# Patient Record
Sex: Male | Born: 1965 | Race: White | Hispanic: No | Marital: Married | Smoking: Former smoker
Health system: Southern US, Community
[De-identification: ages and names within clinical notes are randomized; demographics above are authoritative.]

## PROBLEM LIST (undated history)

## (undated) DIAGNOSIS — N529 Male erectile dysfunction, unspecified: Secondary | ICD-10-CM

## (undated) DIAGNOSIS — E785 Hyperlipidemia, unspecified: Secondary | ICD-10-CM

## (undated) DIAGNOSIS — K219 Gastro-esophageal reflux disease without esophagitis: Secondary | ICD-10-CM

## (undated) HISTORY — PX: TONSILLECTOMY: SUR1361

## (undated) HISTORY — DX: Hyperlipidemia, unspecified: E78.5

## (undated) HISTORY — PX: ANKLE SURGERY: SHX546

## (undated) HISTORY — PX: VASECTOMY: SHX75

## (undated) HISTORY — PX: ABSCESS DRAINAGE: SHX1119

## (undated) HISTORY — DX: Gastro-esophageal reflux disease without esophagitis: K21.9

## (undated) HISTORY — DX: Male erectile dysfunction, unspecified: N52.9

---

## 2001-02-08 ENCOUNTER — Emergency Department (HOSPITAL_COMMUNITY): Admission: EM | Admit: 2001-02-08 | Discharge: 2001-02-08 | Payer: Self-pay | Admitting: Emergency Medicine

## 2001-02-08 ENCOUNTER — Encounter: Payer: Self-pay | Admitting: Emergency Medicine

## 2005-10-15 ENCOUNTER — Ambulatory Visit: Payer: Self-pay | Admitting: Family Medicine

## 2006-02-04 ENCOUNTER — Ambulatory Visit: Payer: Self-pay | Admitting: Family Medicine

## 2006-09-15 ENCOUNTER — Ambulatory Visit: Payer: Self-pay | Admitting: Family Medicine

## 2006-09-18 ENCOUNTER — Ambulatory Visit: Payer: Self-pay | Admitting: Family Medicine

## 2006-12-29 ENCOUNTER — Ambulatory Visit: Payer: Self-pay | Admitting: Family Medicine

## 2006-12-30 ENCOUNTER — Encounter: Payer: Self-pay | Admitting: Endocrinology

## 2007-06-04 ENCOUNTER — Ambulatory Visit: Payer: Self-pay | Admitting: Family Medicine

## 2007-08-04 DIAGNOSIS — F528 Other sexual dysfunction not due to a substance or known physiological condition: Secondary | ICD-10-CM

## 2007-08-05 ENCOUNTER — Ambulatory Visit: Payer: Self-pay | Admitting: Endocrinology

## 2007-08-05 DIAGNOSIS — R05 Cough: Secondary | ICD-10-CM

## 2007-08-05 DIAGNOSIS — E785 Hyperlipidemia, unspecified: Secondary | ICD-10-CM

## 2007-08-05 DIAGNOSIS — E109 Type 1 diabetes mellitus without complications: Secondary | ICD-10-CM

## 2008-04-27 ENCOUNTER — Ambulatory Visit: Payer: Self-pay | Admitting: Family Medicine

## 2009-03-10 ENCOUNTER — Ambulatory Visit: Payer: Self-pay | Admitting: Family Medicine

## 2010-02-08 ENCOUNTER — Ambulatory Visit
Admission: RE | Admit: 2010-02-08 | Discharge: 2010-02-08 | Payer: Self-pay | Source: Home / Self Care | Attending: Family Medicine | Admitting: Family Medicine

## 2010-09-26 ENCOUNTER — Ambulatory Visit (INDEPENDENT_AMBULATORY_CARE_PROVIDER_SITE_OTHER): Payer: BC Managed Care – PPO | Admitting: Medical

## 2010-09-26 ENCOUNTER — Encounter: Payer: Self-pay | Admitting: Medical

## 2010-09-26 VITALS — BP 110/78 | HR 72 | Temp 98.0°F | Resp 20 | Ht 73.0 in | Wt 234.0 lb

## 2010-09-26 DIAGNOSIS — R05 Cough: Secondary | ICD-10-CM

## 2010-09-26 DIAGNOSIS — J029 Acute pharyngitis, unspecified: Secondary | ICD-10-CM

## 2010-09-26 NOTE — Progress Notes (Signed)
Subjective:   HPI Sean Callahan is a 45 y.o. male who presents for one day history of sore throat bad cough that kept him up all night.  He noted an irritation in his throat, coughing every 20-30 minutes all night, like he had something stuck in his throat like a hair or particle.  He also notes some headache this morning and some postnasal drip.  He denies other allergy symptoms. No sick contacts.  Otherwise feels fine.   He does take a medication for reflux but can't recall the name.  No other aggravating or relieving factors.  No other c/o.  The following portions of the patient's history were reviewed and updated as appropriate: allergies, current medications, past family history, past medical history, past social history, past surgical history and problem list.  Past Medical History  Diagnosis Date  . Diabetes mellitus     Review of Systems  general: No fever, chills, sweats Skin: No rash HEENT: No ear pain, sinus pressure, runny nose, sneezing GI: No abdominal pain, nausea, vomiting Lungs: No shortness of breath, wheezing, non-productive sputum      Objective:   Physical Exam  General appearance: alert, no distress, WD/WN, well appearing white male Skin: unremarkable HEENT:   conjunctiva normal, left turbinate swollen and pink, right nare clear, TMs pearly, pharynx with postnasal drip and cobblestoning slight erythema, no exit or foreign body  Neck: Supple, no lymphadenopathy, no mass, no thyromegaly Lungs: CTA bilaterally, no wheezes, rhonchi, Rales     Assessment :    Encounter Diagnoses  Name Primary?  . Pharyngitis Yes  . Cough      Plan:     Cough appears to be from postnasal drip.   Advised salt water gargles, warm fluids, over-the-counter Zyrtec, rest, hydrate well, over-the-counter Delsym.  He declined stronger cough medicine today. If worse or not improving in the next 4-5 days let us know.

## 2010-10-23 ENCOUNTER — Telehealth: Payer: Self-pay | Admitting: *Deleted

## 2010-10-23 NOTE — Telephone Encounter (Signed)
Needs appt with Dr. Susann Givens to discuss endocrinology referral.

## 2010-10-23 NOTE — Telephone Encounter (Signed)
Spoke with pt and pt has an appointment with Dr. Susann Givens on 10-26-10 at 8:30am for endocrinology referral.  CM, LPN

## 2010-10-26 ENCOUNTER — Encounter: Payer: Self-pay | Admitting: Family Medicine

## 2010-10-26 ENCOUNTER — Ambulatory Visit (INDEPENDENT_AMBULATORY_CARE_PROVIDER_SITE_OTHER): Payer: BC Managed Care – PPO | Admitting: Family Medicine

## 2010-10-26 DIAGNOSIS — E119 Type 2 diabetes mellitus without complications: Secondary | ICD-10-CM

## 2010-10-26 NOTE — Progress Notes (Signed)
  Subjective:    Patient ID: Sean Callahan, male    DOB: 02/25/1965, 45 y.o.   MRN: [id number]  HPI He is here to discuss diabetes. Presently he is on 15 units of regular before every meal and uses NPH at night 30 units. He has been on this dose and schedule for at least 2 years. He has noted his hemoglobin A1c going from 6.5 to 9.2. He is also had intermittent ailing sensation in his feet and occasionally in his hands. He is being cared for at the [facility]. He and his wife are concerned about the increasing hemoglobin A1c.   Review of Systems     Objective:   Physical Exam Alert and in no distress. Foot exam shows normal sensation and vibratory. Pulses were diminished. Ankle reflexes gone. Skin appears normal.       Assessment & Plan:  Insulin-dependent diabetes Both he and his wife would like him to see an endocrinologist outside the [facility] system for their input into his care. I also discussed the need for him to monitor his sugars more closely and adjust insulin doses to get the blood sugar in the morning under 120. He is also to try and keep his sugars after meals less than 180

## 2010-10-26 NOTE — Patient Instructions (Signed)
Keep your blood sugars in the morning under 120 and try to keep them under 180 2R is after meals. We will call you with the referral to endocrinology.

## 2010-12-10 ENCOUNTER — Encounter: Payer: Self-pay | Admitting: Family Medicine

## 2010-12-11 ENCOUNTER — Institutional Professional Consult (permissible substitution): Payer: BC Managed Care – PPO | Admitting: Medical

## 2011-02-19 ENCOUNTER — Ambulatory Visit (INDEPENDENT_AMBULATORY_CARE_PROVIDER_SITE_OTHER): Payer: BC Managed Care – PPO | Admitting: Medical

## 2011-02-19 ENCOUNTER — Encounter: Payer: Self-pay | Admitting: Medical

## 2011-02-19 VITALS — BP 124/80 | HR 76 | Ht 74.5 in | Wt 242.0 lb

## 2011-02-19 DIAGNOSIS — R209 Unspecified disturbances of skin sensation: Secondary | ICD-10-CM

## 2011-02-19 DIAGNOSIS — M2550 Pain in unspecified joint: Secondary | ICD-10-CM

## 2011-02-19 DIAGNOSIS — N529 Male erectile dysfunction, unspecified: Secondary | ICD-10-CM

## 2011-02-19 DIAGNOSIS — M79609 Pain in unspecified limb: Secondary | ICD-10-CM

## 2011-02-19 NOTE — Progress Notes (Signed)
  Subjective:   HPI Sean Callahan is a 46 y.o. male who presents for multiple issues.  For at least the last 2-3 months he has had issues with numbness and pain in bilat hands.  He has had numbness in feet during the same time, but not pain like in the hands.  He says he numbness alternates between left and right hands, started out as numbness mostly in 1st and 2nd fingers, but now more 2nd - 4th fingers, worse on the left.  He says he can awake in the morning with numbness despite sleeping on a particular side.  He denies neck pain.  He does get joint pain in the left hand.  Gets stiffness in the same joint.   Denies joint swelling or redness.  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  No prior diagnosis or treatment for this other that suspected neuropathy since he is a diabetic and his HgbA1C has not been controlled.  However, this has recently improved from 9.2% down to 7%. He sees the [facility] for most of his labs and chronic issues.   He notes seeing ortho a while back for adhesive capsulitis, but no self or family hx/o rheumatologic disease except grandmother with arthritis.   He denies burning pain.  No recent trauma or injury in the last several moths.  No other joint or neck pain.  No other aggravating or relieving factors.      He has ED, and does well on Viagra.  He uses a Systems developer.   Wants script refill. No other c/o.  The following portions of the patient's history were reviewed and updated as appropriate: allergies, current medications, past family history, past medical history, past social history, past surgical history and problem list.  Past Medical History  Diagnosis Date  . Diabetes mellitus   . ED (erectile dysfunction)   . Hyperlipidemia   . GERD (gastroesophageal reflux disease)    Review of Systems Gen: no fever, chills, sweats, wt changes Skin: no rash Heart: no CP, palpitations, edema Lungs: no SOB or wheezing GI: negative     Objective:   Physical Exam  General appearance: alert, no distress, WD/WN Skin: no rash Heart: RRR, normal S1, S2 MSK: tender over left MCPs in general, no bony abnormity, but otherwise nontender, no joint swelling, rest of bilat UE including shoulder exam with normal ROM, nontender, negative special tests of shoulders Neuro: bilat hands with normal sensation to light and sharp touch, -Tinels but somewhat positive Phalens, normal strength bilat, otherwise neuro exam of extremities seems WNL Pulses: 2+ bilat UE and LE    Assessment and Plan:    Encounter Diagnoses  Name Primary?  . Paresthesia and pain of extremity Yes  . ED (erectile dysfunction)   . Joint pain    Paresthesias - likely diabetic neuropathy, but possibly carpal and ulnar nerve syndromes as well.  Will refer for EMG/NCS.  I asked him to get me copies of the recent labs including B12 he had at the [facility].  ED - refilled medications  Joint pain - OTC Tylenol advised.  Follow up pending studies.

## 2011-02-20 ENCOUNTER — Telehealth: Payer: Self-pay | Admitting: Family Medicine

## 2011-02-20 ENCOUNTER — Other Ambulatory Visit: Payer: Self-pay | Admitting: Medical

## 2011-02-20 ENCOUNTER — Encounter: Payer: Self-pay | Admitting: Medical

## 2011-02-20 DIAGNOSIS — M2550 Pain in unspecified joint: Secondary | ICD-10-CM | POA: Insufficient documentation

## 2011-02-20 DIAGNOSIS — R202 Paresthesia of skin: Secondary | ICD-10-CM | POA: Insufficient documentation

## 2011-02-20 DIAGNOSIS — N529 Male erectile dysfunction, unspecified: Secondary | ICD-10-CM | POA: Insufficient documentation

## 2011-02-20 MED ORDER — SILDENAFIL CITRATE 100 MG PO TABS: ORAL_TABLET | ORAL | Status: DC

## 2011-02-20 MED ORDER — SILDENAFIL CITRATE 25 MG PO TABS: 100.0000 mg | ORAL_TABLET | Freq: Every day | ORAL | Status: DC | PRN

## 2011-02-20 NOTE — Telephone Encounter (Signed)
Message copied by Janeice Robinson on Wed Feb 20, 2011  2:23 PM ------      Message from: Leola, Keston S      Created: Wed Feb 20, 2011  4:34 AM       Karren Burly - pls set up EMG/NCS (nerve conduction studies) for upper and lower extremities.  [facility] believe [facility] them, but hospital may do them as well. Pls call pt with appt time.            Melissa - not sure if I ordered the test right in the system

## 2011-02-20 NOTE — Telephone Encounter (Signed)
Patients appointment is schedule at [facility] on1/28/13 and lower on 02/28/11. CLS

## 2011-03-18 ENCOUNTER — Telehealth: Payer: Self-pay | Admitting: Family Medicine

## 2011-03-18 ENCOUNTER — Other Ambulatory Visit: Payer: Self-pay | Admitting: Medical

## 2011-03-18 MED ORDER — GABAPENTIN 100 MG PO CAPS: ORAL_CAPSULE | ORAL | Status: DC

## 2011-03-18 NOTE — Telephone Encounter (Signed)
PATIENT STATES THAT HE WILL TRY THE WRIST SPLINTS FIRST TO SEE IF THEY HELP. HE WILL CALL us BACK TO LET us KNOW IN A FEW WEEKS. PATIENT STATES THAT HE WILLING TO TRY THE GABAPENTIN SO IF YOU COULD PLEASE SEND IT OUT TO HIS PHARMACY. CLS    PATIENT WAS NOTIFIED OF HIS NERVE CONDUCTIONS STUDIES. CLS

## 2011-03-18 NOTE — Telephone Encounter (Signed)
Message copied by Janeice Robinson on Mon Mar 18, 2011 11:39 AM ------      Message from: Aleen Campi, Kyston S      Created: Mon Mar 18, 2011  8:09 AM       His nerve conduction studies are back.  Both upper and lower are abnormal.  The results suggest both diabetic neuropathy as well as carpal tunnel syndrome.             Regarding the carpal tunnel (CTS) - see if he is using night time wrist splints.  If he has been doing this already, then next steps would include either injection at the wrist of steroid or potential surgical correction of the CTS.  Dr. Susann Givens may do the injection if he wants to start with this.  Otherwise we can refer for surgical consult.   See what he wants to do (if referral, does he want ortho here local or at the [facility]?).            Regarding lower extremity neuropathy, I can start him on a medication called Gabapentin.  See if agreeable?

## 2011-03-18 NOTE — Progress Notes (Signed)
LMOM FOR THE PATIENT TO CALLBACK ABOUT HIS PHARMACY. CLS

## 2011-03-21 ENCOUNTER — Ambulatory Visit (INDEPENDENT_AMBULATORY_CARE_PROVIDER_SITE_OTHER): Payer: BC Managed Care – PPO | Admitting: Family Medicine

## 2011-03-21 ENCOUNTER — Encounter: Payer: Self-pay | Admitting: Family Medicine

## 2011-03-21 VITALS — BP 120/70 | HR 81 | Wt 239.0 lb

## 2011-03-21 DIAGNOSIS — IMO0002 Reserved for concepts with insufficient information to code with codable children: Secondary | ICD-10-CM

## 2011-03-21 DIAGNOSIS — T07XXXA Unspecified multiple injuries, initial encounter: Secondary | ICD-10-CM

## 2011-03-21 MED ORDER — GABAPENTIN 100 MG PO CAPS: ORAL_CAPSULE | ORAL | Status: DC

## 2011-03-21 NOTE — Patient Instructions (Signed)
Return here if the abrasions do not continue to heal.

## 2011-03-21 NOTE — Progress Notes (Signed)
  Subjective:    Patient ID: Sean Callahan, male    DOB: 1965-01-31, 46 y.o.   MRN: [id number]  HPI Approximately one week ago he injured himself while [place] in the [place]. He did have some abrasions to the anterior aspect of both shins, right greater than left. He is here for evaluation of this. He states that this is healing well but his wife wanted him to come in.   Review of Systems     Objective:   Physical Exam Healing abrasions are noted on both hands. There is slight erythema around the scabs however it is not warm or tender.       Assessment & Plan:  Healing shin abrasions. Continue with conservative care. He will return here if he gets more red hot or tender. Also his Neurontin will be sent for renewal to the [facility]

## 2011-03-22 NOTE — Progress Notes (Signed)
Have you got in touch with him?

## 2011-03-25 NOTE — Progress Notes (Signed)
SHANE I HAVE SPOKE WITH THIS PATIENT ALREADY ABOUT HIS RESULTS AND THE RX AND THE PHARMACY. HE WILL CONTACT us IF THE MEDICATION AND THE WRIST SPLINTS ARE NOT HELPING. CLS

## 2011-04-15 ENCOUNTER — Other Ambulatory Visit: Payer: Self-pay | Admitting: Medical

## 2011-04-15 MED ORDER — GABAPENTIN 100 MG PO CAPS: ORAL_CAPSULE | ORAL | Status: DC

## 2011-05-23 ENCOUNTER — Encounter: Payer: Self-pay | Admitting: Specialist

## 2011-06-05 ENCOUNTER — Ambulatory Visit (INDEPENDENT_AMBULATORY_CARE_PROVIDER_SITE_OTHER): Payer: BC Managed Care – PPO | Admitting: Medical

## 2011-06-05 ENCOUNTER — Encounter: Payer: Self-pay | Admitting: Medical

## 2011-06-05 VITALS — BP 112/78 | HR 100 | Temp 99.5°F | Resp 16 | Wt 237.0 lb

## 2011-06-05 DIAGNOSIS — J329 Chronic sinusitis, unspecified: Secondary | ICD-10-CM

## 2011-06-05 DIAGNOSIS — R05 Cough: Secondary | ICD-10-CM

## 2011-06-05 DIAGNOSIS — J40 Bronchitis, not specified as acute or chronic: Secondary | ICD-10-CM

## 2011-06-05 MED ORDER — HYDROCODONE-HOMATROPINE 5-1.5 MG/5ML PO SYRP: 5.0000 mL | ORAL_SOLUTION | Freq: Three times a day (TID) | ORAL | Status: AC | PRN

## 2011-06-05 MED ORDER — AMOXICILLIN-POT CLAVULANATE 875-125 MG PO TABS: 1.0000 | ORAL_TABLET | Freq: Two times a day (BID) | ORAL | Status: AC

## 2011-06-05 NOTE — Patient Instructions (Signed)
Begin Augmentin antibiotic twice daily for 10 days for sinobronchitis.  Rest, drink plenty of water.   Consider using OTC Zyrtec or Benadryl or Coricidin HBP for congestion and allergy symptom.  Use the Hycodan syrup for worse cough either at bedtime or every 4-6 hours as needed; can cause drowsiness.    If worse or not improving, call or return.

## 2011-06-05 NOTE — Progress Notes (Signed)
Subjective: Here for c/o cough and illness.  He has been working outdoors several days, thought he was getting some allergy problems.  But last 3 days with cough, low grade fever, constant cough lying flat, cough every 15-20 minutes standing, some chest tightness, sinus pressure, sore throat, post nasal drainage.  He currently feels like he did 10 years ago when he came down with pneumonia.   Using Tylenol for symptoms.  Son has been sick with sinusitis 2 wk ago.  No other aggravating or relieving factors.  No other c/o.  Past Medical History  Diagnosis Date  . Diabetes mellitus   . ED (erectile dysfunction)   . Hyperlipidemia   . GERD (gastroesophageal reflux disease)    ROS as noted in HPI  Objective:   Filed Vitals:   06/05/11 0916  BP: 112/78  Pulse: 100  Temp: 99.5 F (37.5 C)  Resp: 16    General appearance: Alert, WD/WN, no distress                             Skin: warm/hot, moist                           Head: + mild maxillary sinus tenderness,                            Eyes: conjunctiva normal, corneas clear, PERRLA                            Ears: flatTMs, external ear canals normal                          Nose: septum midline, turbinates swollen, with erythema and clear discharge             Mouth/throat: MMM, tongue normal, mild pharyngeal erythema                           Neck: supple, no adenopathy, no thyromegaly, nontender                          Heart: RRR, normal S1, S2, no murmurs                         Lungs: CTA bilaterally, no wheezes, rales, or rhonchi      Assessment and Plan:   Encounter Diagnoses  Name Primary?  . Sinobronchitis Yes  . Cough    Prescription given for Augmentin and Hycodan syrup.  Can use OTC Coricidin HBP and/or Zyrtec for congestion.  Tylenol or Ibuprofen OTC for fever and malaise.  Discussed symptomatic relief, nasal saline, and call or return if worse or not improving in 2-3 days.

## 2011-06-12 ENCOUNTER — Telehealth: Payer: Self-pay | Admitting: Medical

## 2011-06-12 NOTE — Telephone Encounter (Signed)
Lets finish antibiotic, rest, hydrate well.  Hopefully by the time he finishes the antibiotic will feel resolved.  If not let me know

## 2011-06-12 NOTE — Telephone Encounter (Signed)
PATIENT WAS NOTIFIED OF Kristian Covey PA-C RECOMMENDATIONS FOR HIS CARE. CLS

## 2012-04-21 ENCOUNTER — Emergency Department: Payer: Self-pay | Admitting: Emergency Medicine

## 2012-04-22 ENCOUNTER — Ambulatory Visit (INDEPENDENT_AMBULATORY_CARE_PROVIDER_SITE_OTHER): Payer: BC Managed Care – PPO | Admitting: Medical

## 2012-04-22 ENCOUNTER — Encounter: Payer: Self-pay | Admitting: Medical

## 2012-04-22 VITALS — BP 122/80 | HR 94 | Temp 97.9°F | Resp 16 | Wt 233.0 lb

## 2012-04-22 DIAGNOSIS — N529 Male erectile dysfunction, unspecified: Secondary | ICD-10-CM

## 2012-04-22 DIAGNOSIS — T148 Other injury of unspecified body region: Secondary | ICD-10-CM

## 2012-04-22 DIAGNOSIS — W57XXXA Bitten or stung by nonvenomous insect and other nonvenomous arthropods, initial encounter: Secondary | ICD-10-CM

## 2012-04-22 MED ORDER — DOXYCYCLINE HYCLATE 100 MG PO TABS: 100.0000 mg | ORAL_TABLET | Freq: Two times a day (BID) | ORAL | Status: DC

## 2012-04-22 MED ORDER — SILDENAFIL CITRATE 100 MG PO TABS: ORAL_TABLET | ORAL | Status: DC

## 2012-04-22 NOTE — Progress Notes (Signed)
Subjective: Here for tick bite.   Was clearing out brush in yard, wearing boots, jeans.  Checked body afterwards, didn't see ticks, but next day found tick on bottom of penis at foreskin.   Removed the tick carefully.  Hasn't seen other ticks on body.  Denies rash, but he has had some headaches and has some swelling at the penis.   Went to the ED last night for the same.  Waited 7 hours in the ED without being seen and decided to f/u here.  Denies fever, but has felt kind of hot on and off.  Denies body aches, rash, nausea, vomiting, diarrhea, overall feels fine except being tired.  Just got into town and got off airplane yesterday, been up 48 hours, so he is tired and not sure how much of his symptoms are related to lack of sleep and needing to rest.    Also needs Viagra refilled.  Does well on this, usually sends script for cheaper medication in [place].  Past Medical History  Diagnosis Date  . Diabetes mellitus   . ED (erectile dysfunction)   . Hyperlipidemia   . GERD (gastroesophageal reflux disease)    ROS as in subjective   Objective: Gen: wd, wn, nad Skin: volar side of distal penis just proximal to glans with small 2mm crusted area from prior bite, mild generalized swelling, no erythema, no warmth, no pus, small 2mm area of round flat erythema of left dorsal glans of penis ridge   Assessment: Encounter Diagnoses  Name Primary?  . Tick bite Yes  . Erectile dysfunction    Plan: Tick bite - no current worrisome signs for RMSF or lyme.   Advised soap and water, local skin care for the bite area of the penis, cool pack to penis for swelling, benadryl OTC and Aleve OTC the next few days.  If any signs of tick borne illness as discussed - fever, joint aches, body aches, rash, continuing headache, nausea, vomiting, etc, then begin Doxycycline and call/recheck.    ED - refilled Viagra, works well for him.   No adverse effects, no problems with the medication.

## 2012-04-22 NOTE — Patient Instructions (Signed)
[place] Spotted Fever [place] Spotted Fever (RMSF) is the oldest known tick-borne disease of people in the [place]. This disease was named because it was first described among people in the [place] area who had an illness characterized by a rash with red-purple-black spots. This disease is caused by a rickettsia (Rickettsia rickettsii), a bacteria carried by the tick. The [place] wood tick and the American dog tick, acquire and transmit the RMSF bacteria (pictures NOT actual size). When a larval, nymphal or adult tick feeds on an infected rodent or larger animal, the tick can become infected. Infected adult ticks then feed on people who may then get RMSF. The tick transmits the disease to humans during a prolonged period of feeding that lasts many hours, days or even a couple weeks. The bite is painless and frequently goes unnoticed. An infected male tick may also pass the rickettsial bacteria to her eggs that then may mature to be infected adult ticks. The rickettsia that causes RMSF can also get into a person's body through damaged skin. A tick bite is not necessary. People can get RMSF if they crush a tick and get it's blood or body fluids on their skin through a small cut or sore.  DIAGNOSIS Diagnosis is made by laboratory tests.  TREATMENT Treatment is with antibiotics (medications that kill rickettsia and other bacteria). Immediate treatment usually prevents death. GEOGRAPHIC RANGE This disease was reported only in the [place] until 1931. RMSF has more recently been described among individuals in all states except [place], [place] and [place]. The highest reported incidences of RMSF now occur among residents of [place], [place], [place] and the [place]. TIME OF YEAR  Most cases are diagnosed during late spring and summer when ticks are most active. However, especially in the warmer southern states, a few cases occur during the winter. SYMPTOMS    Symptoms of RMSF begin from 2 to 14 days after a tick bite. The most common early symptoms are fever, muscle aches and headache followed by nausea (feeling sick to your stomach) or vomiting.  The RMSF rash is typically delayed until 3 or more days after symptom onset, and eventually develops in 9 of 10 infected patients by the 5th day of illness. If the disease is not treated it can cause death. If you get a fever, headache, muscle aches, rash, nausea or vomiting within 2 weeks of a possible tick bite or exposure you should see your caregiver immediately. PREVENTION Ticks prefer to hide in shady, moist ground litter. They can often be found above the ground clinging to tall grass, brush, shrubs and low tree branches. They also inhabit lawns and gardens, especially at the edges of woodlands and around old stone walls. Within the areas where ticks generally live, no naturally vegetated area can be considered completely free of infected ticks. The best precaution against RMSF is to avoid contact with soil, leaf litter and vegetation as much as possible in tick infested areas. For those who enjoy gardening or walking in their yards, clear brush and mow tall grass around houses and at the edges of gardens. This may help reduce the tick population in the immediate area. Applications of chemical insecticides by a licensed professional in the spring (late May) and Fall (September) will also control ticks, especially in heavily infested areas. Treatment will never get rid of all the ticks. Getting rid of small animal populations that host ticks will also decrease the tick population. When working in the garden,   pruning shrubs, or handling soil and vegetation, wear light-colored protective clothing and gloves. Spot-check often to prevent ticks from reaching the skin. Ticks cannot jump or fly. They will not drop from an above-ground perch onto a passing animal. Once a tick gains access to human skin it climbs upward  until it reaches a more protected area. For example, the back of the knee, groin, navel, armpit, ears or nape of the neck. It then begins the slow process of embedding itself in the skin. Campers, hikers, field workers, and others who spend time in wooded, brushy or tall grassy areas can avoid exposure to ticks by using the following precautions:  Wear light-colored clothing with a tight weave to spot ticks more easily and prevent contact with the skin.  Wear long pants tucked into socks, long-sleeved shirts tucked into pants and enclosed shoes or boots along with insect repellent.  Spray clothes with insect repellent containing either DEET or Permethrin. Only DEET can be used on exposed skin. Follow the manufacturer's directions carefully.  Wear a hat and keep long hair pulled back.  Stay on cleared, well-worn trails whenever possible.  Spot-check yourself and others often for the presence of ticks on clothes. If you find one, there are likely to be others. Check thoroughly.  Remove clothes after leaving tick-infested areas. If possible, wash them to eliminate any unseen ticks. Check yourself, your children and any pets from head to toe for the presence of ticks.  Shower and shampoo. You can greatly reduce your chances of contracting RMSF if you remove attached ticks as soon as possible. Regular checks of the body, including all body sites covered by hair (head, armpits, genitals), allow removal of the tick before rickettsial transmission. To remove an attached tick, use a forceps or tweezers to detach the intact tick without leaving mouth parts in the skin. The tick bite wound should be cleansed after tick removal. Remember the most common symptoms of RMSF are fever, muscle aches, headache and nausea or vomiting with a later onset of rash. If you get these symptoms after a tick bite and while living in an area where RMSF is found, RMSF should be suspected. If the disease is not treated, it can  cause death. See your caregiver immediately if you get these symptoms. Do this even if not aware of a tick bite. Document Released: 04/28/2000 Document Revised: 04/08/2011 Document Reviewed: 12/19/2008 Exit[employer] Patient Information 2013 [employer]. Lyme Disease You may have been bitten by a tick and are to watch for the development of Lyme Disease. Lyme Disease is an infection that is caused by a bacteria The bacteria causing this disease is named Borreilia burgdorferi. If a tick is infected with this bacteria and then bites you, then Lyme Disease may occur. These ticks are carried by deer and rodents such as rabbits and mice and infest grassy as well as forested areas. Fortunately most tick bites do not cause Lyme Disease.  Lyme Disease is easier to prevent than to treat. First, covering your legs with clothing when walking in areas where ticks are possibly abundant will prevent their attachment because ticks tend to stay within inches of the ground. Second, using insecticides containing DEET can be applied on skin or clothing. Last, because it takes about 12 to 24 hours for the tick to transmit the disease after attachment to the human host, you should inspect your body for ticks twice a day when you are in areas where Lyme Disease is common. You must   look thoroughly when searching for ticks. The Ixodes tick that carries Lyme Disease is very small. It is around the size of a sesame seed (picture of tick is not actual size). Removal is best done by grasping the tick by the head and pulling it out. Do not to squeeze the body of the tick. This could inject the infecting bacteria into the bite site. Wash the area of the bite with an antiseptic solution after removal.  Lyme Disease is a disease that may affect many body systems. Because of the small size of the biting tick, most people do not notice being bitten. The first sign of an infection is usually a round red rash that extends out from the center of  the tick bite. The center of the lesion may be blood colored (hemorrhagic) or have tiny blisters (vesicular). Most lesions have bright red outer borders and partial central clearing. This rash may extend out many inches in diameter, and multiple lesions may be present. Other symptoms such as fatigue, headaches, chills and fever, general achiness and swelling of lymph glands may also occur. If this first stage of the disease is left untreated, these symptoms may gradually resolve by themselves, or progressive symptoms may occur because of spread of infection to other areas of the body.  Follow up with your caregiver to have testing and treatment if you have a tick bite and you develop any of the above complaints. Your caregiver may recommend preventative (prophylactic) medications which kill bacteria (antibiotics). Once a diagnosis of Lyme Disease is made, antibiotic treatment is highly likely to cure the disease. Effective treatment of late stage Lyme Disease may require longer courses of antibiotic therapy.  MAKE SURE YOU:   Understand these instructions.  Will watch your condition.  Will get help right away if you are not doing well or get worse. Document Released: 04/22/2000 Document Revised: 04/08/2011 Document Reviewed: 06/24/2008 Exit[employer] Patient Information 2013 [employer].  

## 2012-05-19 ENCOUNTER — Encounter: Payer: Self-pay | Admitting: Medical

## 2012-05-19 ENCOUNTER — Ambulatory Visit (INDEPENDENT_AMBULATORY_CARE_PROVIDER_SITE_OTHER): Payer: BC Managed Care – PPO | Admitting: Medical

## 2012-05-19 VITALS — BP 100/70 | HR 68 | Temp 97.8°F | Resp 16 | Wt 234.0 lb

## 2012-05-19 DIAGNOSIS — H9313 Tinnitus, bilateral: Secondary | ICD-10-CM

## 2012-05-19 DIAGNOSIS — H9202 Otalgia, left ear: Secondary | ICD-10-CM

## 2012-05-19 DIAGNOSIS — H9209 Otalgia, unspecified ear: Secondary | ICD-10-CM

## 2012-05-19 DIAGNOSIS — H9319 Tinnitus, unspecified ear: Secondary | ICD-10-CM

## 2012-05-19 DIAGNOSIS — R51 Headache: Secondary | ICD-10-CM

## 2012-05-19 NOTE — Progress Notes (Signed)
Subjective: Here for recheck.  Saw me recently for tick bite and other symptoms.  Finished antibiotic (doxycycline) this last visit.   Insect bites are healing up just fine.  Was feeling fine until starting to have symptoms 1.5 wk ago.  He notes 1.5 wk hx/o joints pains in shoulders and neck, ringing in the ears.  4-5 days ago started getting intense pain around left side of his head.     He has had ringing in the ears 6-7 years ago, lasted for several months.  Ended up resolving on its on.  This time the ringing in ears seemed to start suddenly.  Wonders about his left ear. Has pain in left side of face.  Pain is ear ache like.   Pain is at times dull, at times nagging, at times sharp.  Has to have background noise on the help.  Pain is mostly left postauricular region.  No facial or frontal pain.  No fever.  Occasionally dizziness.   Seems like hearing is difficult on the left.   Having some nasal congestion in the evenings, but no during the day.   No sinus pressure, but some headache frontal/generalized.   No popping in ears.  Using nothing for symptoms other than Tylenol.    Having some mild joint pains in shoulders and neck.     Past Medical History  Diagnosis Date  . Diabetes mellitus   . ED (erectile dysfunction)   . Hyperlipidemia   . GERD (gastroesophageal reflux disease)     ROS as in subjective  Objective: Filed Vitals:   05/19/12 1410  BP: 100/70  Pulse: 68  Temp: 97.8 F (36.6 C)  Resp: 16    General appearance: alert, no distress, WD/WN Head nontender, no TMJ tenderness  HEENT: normocephalic, sclerae anicteric, TMs pearly, nares patent, no discharge or erythema, pharynx normal Oral cavity: MMM, no lesions Neck: supple, no lymphadenopathy, no thyromegaly, no masses Heart: RRR, normal S1, S2, no murmurs Lungs: CTA bilaterally, no wheezes, rhonchi, or rales Pulses: 2+ symmetric, upper and lower extremities, normal cap refill Neuro: Cn2-12 intact, nonfocal exam Weber and  rinne normal  Assessment: Encounter Diagnoses  Name Primary?  . Tinnitus of both ears Yes  . Headache   . Otalgia of left ear     Plan: reviewed hearing screen today.   Etiology not clear.   No clear meniere's or other, no obvious TMJ, trigeminal neuralgia or other. No exam signs of TM effusion.  Advised short term 1-2 wk trial of zyrtec or benadryl, stop aspirin temporarily, back off alcohol and caffeine use compared to normal, hydrate well, and if no improvement in 1-2 wk, referral to ENT.

## 2012-05-19 NOTE — Patient Instructions (Signed)
For the next 2 weeks Back off alcohol, caffeine and aspirin daily  Begin Zyrtec or Benadryl at night time, increase water intake  If no improvement in 1-2 weeks call back.  Tinnitus Sounds you hear in your ears and coming from within the ear is called tinnitus. This can be a symptom of many ear disorders. It is often associated with hearing loss.  Tinnitus can be seen with:  Infections.  Ear blockages such as wax buildup.  Meniere's disease.  Ear damage.  Inherited.  Occupational causes. While irritating, it is not usually a threat to health. When the cause of the tinnitus is wax, infection in the middle ear, or foreign body it is easily treated. Hearing loss will usually be reversible.  TREATMENT  When treating the underlying cause does not get rid of tinnitus, it may be necessary to get rid of the unwanted sound by covering it up with more pleasant background noises. This may include music, the radio etc. There are tinnitus maskers which can be worn which produce background noise to cover up the tinnitus. Avoid all medications which tend to make tinnitus worse such as alcohol, caffeine, aspirin, and nicotine. There are many soothing background tapes such as rain, ocean, thunderstorms, etc. These soothing sounds help with sleeping or resting. Keep all follow-up appointments and referrals. This is important to identify the cause of the problem. It also helps avoid complications, impaired hearing, disability, or chronic pain. Document Released: 01/14/2005 Document Revised: 04/08/2011 Document Reviewed: 09/02/2007 [facility] Patient Information 2013 [place], [place].

## 2012-05-25 ENCOUNTER — Telehealth: Payer: Self-pay | Admitting: Family Medicine

## 2012-05-25 NOTE — Telephone Encounter (Signed)
Go ahead and refer for tinnitus - ENT, Dr. Ezzard Standing

## 2012-05-25 NOTE — Telephone Encounter (Signed)
Pt called and states his situation has not changed and he wants to go ahead with the ENT Referral.  Please call him with appt [phone]

## 2012-05-25 NOTE — Telephone Encounter (Signed)
Patient states that he is not any better and wants a referral to ENT. What is he being seen for? CLS

## 2012-05-25 NOTE — Telephone Encounter (Signed)
Working on referral. CLS 

## 2012-05-26 NOTE — Telephone Encounter (Signed)
Patient is aware of his appointment to see Dr. Ezzard Standing on Jun 02, 2012 @ 100 pm. CLS [phone]

## 2012-08-03 ENCOUNTER — Encounter: Payer: Self-pay | Admitting: Medical

## 2012-08-03 ENCOUNTER — Ambulatory Visit (INDEPENDENT_AMBULATORY_CARE_PROVIDER_SITE_OTHER): Payer: BC Managed Care – PPO | Admitting: Medical

## 2012-08-03 VITALS — BP 120/80 | HR 68 | Temp 97.7°F | Resp 16 | Wt 232.0 lb

## 2012-08-03 DIAGNOSIS — R0789 Other chest pain: Secondary | ICD-10-CM

## 2012-08-03 DIAGNOSIS — R0600 Dyspnea, unspecified: Secondary | ICD-10-CM

## 2012-08-03 DIAGNOSIS — R0609 Other forms of dyspnea: Secondary | ICD-10-CM

## 2012-08-03 LAB — CBC WITH DIFFERENTIAL/PLATELET
Eosinophils Absolute: 0 10*3/uL (ref 0.0–0.7)
HCT: 46.7 % (ref 39.0–52.0)
Hemoglobin: 16.5 g/dL (ref 13.0–17.0)
Lymphs Abs: 1.4 10*3/uL (ref 0.7–4.0)
MCH: 32.2 pg (ref 26.0–34.0)
Monocytes Relative: 7 % (ref 3–12)
Neutrophils Relative %: 61 % (ref 43–77)
RBC: 5.12 MIL/uL (ref 4.22–5.81)

## 2012-08-03 LAB — BASIC METABOLIC PANEL
Chloride: 102 mEq/L (ref 96–112)
Creat: 1.05 mg/dL (ref 0.50–1.35)
Potassium: 4.1 mEq/L (ref 3.5–5.3)
Sodium: 137 mEq/L (ref 135–145)

## 2012-08-03 MED ORDER — DEXLANSOPRAZOLE 60 MG PO CPDR: 60.0000 mg | DELAYED_RELEASE_CAPSULE | Freq: Every day | ORAL | Status: DC

## 2012-08-03 NOTE — Progress Notes (Signed)
Subjective:    Sean Callahan is a 47 y.o. male who presents for evaluation of chest discomfort. Onset was 2 weeks ago.  Has been having some intermittent episodes of chest discomfort bilat chest along sternal border, lower.  Been getting an episode every few days, lasts 1 to several hours at a time, variable.  At times it has been difficult to catch his breath, particularly sitting.  When he stands, seems to help.  The past few days has gotten some lightheadedness along with these symptoms.  No syncope.   No prior similar.   When not having the episodes, can go run up a hill or get on the elliptical for normal exercise.  Chest discomfort is a soreness.  Main concern is not able to catch full breath.  Patient's cardiac risk factors are: diabetes mellitus and dyslipidemia. Patient's risk factors for DVT/PE: none. Previous cardiac testing: none.  The following portions of the patient's history were reviewed and updated as appropriate: allergies, current medications, past family history, past medical history, past social history, past surgical history and problem list.  Past Medical History  Diagnosis Date  . Diabetes mellitus   . ED (erectile dysfunction)   . Hyperlipidemia   . GERD (gastroesophageal reflux disease)      Review of Systems Constitutional: denies fever, chills, sweats, unexpected weight change, anorexia, fatigue ENT: no runny nose, ear pain, sore throat, hoarseness, sinus pain, hearing loss, epistaxis Gastroenterology: denies abdominal pain,  vomiting, diarrhea, constipation,  Hematology: denies bleeding or bruising problems Musculoskeletal: denies arthralgias, myalgias, joint swelling, back pain, neck pain, cramping, gait changes Ophthalmology: denies vision changes Urology: denies dysuria, difficulty urinating, hematuria, urinary frequency, urgency, incontinence Neurology: no headache, weakness, tingling, numbness, speech abnormality, memory loss, falls, dizziness     Objective:       Filed Vitals:   08/03/12 0952  BP: 120/80  Pulse: 68  Temp: 97.7 F (36.5 C)  Resp: 16    General appearance: alert, no distress, WD/WN, male  HEENT: normocephalic, conjunctiva/corneas normal, sclerae anicteric, PERRLA, EOMi, nares patent, no discharge or erythema, pharynx normal Neck: supple, no lymphadenopathy, no thyromegaly, no JVD, no masses Chest: non tender, normal shape and expansion Heart: RRR, normal S1, S2, no murmurs Lungs: CTA bilaterally, no wheezes, rhonchi, or rales Abdomen: +bs, soft, non tender, non distended, no masses, no hepatomegaly, no splenomegaly, no bruits Extremities: no edema, no cyanosis, no clubbing Pulses: 2+ symmetric, upper and lower extremities, normal cap refill Neurological: alert, oriented x 3, CN2-12 intact, non focal    Cardiographic  Adult ECG Report  Indication: chest tightness  Rate: 66 bpm  Rhythm: normal sinus rhythm  QRS Axis: 56 degrees  PR Interval:  QRS Duration: 86ms  QTc:  Conduction Disturbances: none  Other Abnormalities: none  Patient's cardiac risk factors are: diabetes mellitus, dyslipidemia and male gender.  EKG comparison: none  Narrative Interpretation: normal EKG   Imaging CXR with no obvious abnormalities, some prominent left hilar lymph nodes.  Normal cardiac silhouette.  Will send for radiology over read.    Assessment:   Encounter Diagnoses  Name Primary?  . Chest discomfort Yes  . Dyspnea      Plan:   Discussed symptoms, concerns, possible etiologies, although etiology not clear.  Discussed case with Dr. Susann Givens who also reviewed EKG and CXR.  At this point he will use trial of Dexilant, avoid GERD triggers and see if there are any improvements.   Also advised increasing Aspirin to 325mg  daily  for now.  Call report 2wk.  Discussed signs of MI/ACS that would prompt call to 911.  Consider baseline cardiac eval going forward given risk factors: diabetes, family history,  hyperlipidemia.

## 2012-08-03 NOTE — Patient Instructions (Signed)
For now, begin Dexilant for GERD/indigestion, use 1 capsule in the morning 45 minutes before breakfast.  Stop Omeprazole for now  Avoid acidic and spicy foods  Consider increasing to Aspirin 325mg  daily for the next week or 2  Call back in 1-2 weeks with update.    SYMPTOMS OF A HEART ATTACK The most common signs and symptoms include:   Tightness or squeezing in the chest.   Feeling of heaviness on the chest.   Discomfort in the arms, neck, or jaw.   Shortness of breath and nausea.   Cold, wet skin.   Chest pain or discomfort brought on by physical effort or excitement which increase the oxygen needs of the heart.   SEEK IMMEDIATE MEDICAL CARE IF:   You develop nausea, vomiting, or shortness of breath.   You feel faint, lightheaded, or pass out.   Your chest discomfort gets worse.   You are sweating or experience sudden profound fatigue.   Your discomfort lasts longer than 15 minutes.   If you have a history of heart disease or angina, you should tell your caregiver right away about any increase in the severity or frequency of your chest discomfort or angina attacks. When you have angina, you should stop what you are doing and sit down. This may bring relief in 3 to 5 minutes. If your caregiver has prescribed nitro, take it as directed.   WHAT IS A HEART ATTACK: Myocardial Infarction A myocardial infarction (MI) is damage to the heart that is not reversible. It is also called a heart attack. An MI usually occurs when a heart (coronary) artery becomes blocked or narrowed. This cuts off the blood supply to the heart. When one or more of the heart (coronary) arteries becomes blocked, that area of the heart begins to die. This causes pain felt during an MI.  If you think you might be having an MI, call your local emergency services immediately (911 in [place] It is recommended that you take a 162 mg non-enteric coated aspirin if you do not have an aspirin allergy. Do not drive  yourself to the hospital or wait to see if your symptoms go away. The sooner MI is treated, the greater the amount of heart muscle saved. Time is muscle. It can save your life. CAUSES  An MI can occur from:  A gradual buildup of a fatty substance called plaque. When plaque builds up in the arteries, this condition is called atherosclerosis. This buildup can block or reduce the blood supply to the heart artery(s).   A sudden plaque rupture within a heart artery that causes a blood clot (thrombus). A blood clot can block the heart artery which does not allow blood flow to the heart.   A severe tightening (spasm) of the heart artery. This is a less common cause of a heart attack. When a heart artery spasms, it cuts off blood flow through the artery. Spasms can occur in heart arteries that do not have atherosclerosis.  RISK FACTORS People at risk for an MI usually have one or more risk factors, such as:  High blood pressure.   High cholesterol.   Smoking.   Gender. Men have a higher heart attack risk.   Overweight/obesity.   Age.   Family history.   Lack of exercise.   Diabetes.   Stress.   Excessive alcohol use.   Street drug use (cocaine and methamphetamines).

## 2012-08-10 ENCOUNTER — Telehealth: Payer: Self-pay | Admitting: Family Medicine

## 2012-08-10 NOTE — Telephone Encounter (Signed)
Patient is aware of his CXR results. CLS

## 2012-08-10 NOTE — Telephone Encounter (Signed)
Message copied by Janeice Robinson on Mon Aug 10, 2012  2:23 PM ------      Message from: Jac Canavan      Created: Thu Aug 06, 2012  8:09 AM       CXR overread by radiology shows calcified lymph nodes, but otherwise normal.  No worrisome findings.  Use the 2 wk trial of Dexilant and then recheck on symptoms.  However, if worse symptoms in the meantime, let me know ------

## 2012-09-30 ENCOUNTER — Ambulatory Visit (INDEPENDENT_AMBULATORY_CARE_PROVIDER_SITE_OTHER): Payer: BC Managed Care – PPO | Admitting: Medical

## 2012-09-30 ENCOUNTER — Encounter: Payer: Self-pay | Admitting: Medical

## 2012-09-30 VITALS — BP 102/70 | HR 78 | Temp 98.1°F | Resp 16 | Wt 236.0 lb

## 2012-09-30 DIAGNOSIS — R1900 Intra-abdominal and pelvic swelling, mass and lump, unspecified site: Secondary | ICD-10-CM

## 2012-09-30 DIAGNOSIS — R1909 Other intra-abdominal and pelvic swelling, mass and lump: Secondary | ICD-10-CM

## 2012-09-30 NOTE — Progress Notes (Signed)
Subjective:  Sean Callahan is a 47 y.o. male who presents  for knot in groin.  He was doing a monthly testicular exam and noticed a hard nodule that he has not felt before . This got him worried.   Otherwise, no symptoms.  No fever, weight loss, nausea, pain, or swelling.  Not sure if this is a hernia or not.   No other aggravating or relieving factors.    No other c/o.  The following portions of the patient's history were reviewed and updated as appropriate: allergies, current medications, past family history, past medical history, past social history, past surgical history and problem list.  ROS Otherwise as in subjective above  Objective: Physical Exam  Vital signs reviewed  General appearance: alert, no distress, WD/WN GU: right groin, primarily deep adjacent to right side of base of penis with round 1.5-2cm round hard well defined mass that seems to be in the location of the superior portion of the spermatic cord.  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  No other nodules, mass, hernia or other abnormal finding noted.  otherwise circumcised, no swelling, no rash, no induration, no fluctuance.  No lymphadenopathy.  Assessment: Encounter Diagnosis  Name Primary?  . 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  I personally called [facility] about the case given the location of the nodule, and to recommend the look specifically in the superior right region.  Per radiology, if study inconclusive, may need CT pelvis.   Follow up: pending study

## 2012-10-01 ENCOUNTER — Ambulatory Visit: Payer: BC Managed Care – PPO | Admitting: Medical

## 2012-10-02 ENCOUNTER — Ambulatory Visit
Admission: RE | Admit: 2012-10-02 | Discharge: 2012-10-02 | Disposition: A | Discharge status: Home / Self Care | Payer: BC Managed Care – PPO | Source: Ambulatory Visit | Attending: Medical | Admitting: Medical

## 2012-10-02 DIAGNOSIS — R1909 Other intra-abdominal and pelvic swelling, mass and lump: Secondary | ICD-10-CM

## 2012-10-05 NOTE — Progress Notes (Signed)
lmom to cb. cls 

## 2012-11-03 ENCOUNTER — Encounter: Payer: Self-pay | Admitting: Medical

## 2012-11-03 ENCOUNTER — Ambulatory Visit (INDEPENDENT_AMBULATORY_CARE_PROVIDER_SITE_OTHER): Payer: BC Managed Care – PPO | Admitting: Medical

## 2012-11-03 ENCOUNTER — Telehealth: Payer: Self-pay | Admitting: Family Medicine

## 2012-11-03 VITALS — BP 110/80 | HR 82 | Temp 97.9°F | Resp 16 | Wt 234.0 lb

## 2012-11-03 DIAGNOSIS — Z23 Encounter for immunization: Secondary | ICD-10-CM

## 2012-11-03 DIAGNOSIS — R1900 Intra-abdominal and pelvic swelling, mass and lump, unspecified site: Secondary | ICD-10-CM

## 2012-11-03 DIAGNOSIS — R1909 Other intra-abdominal and pelvic swelling, mass and lump: Secondary | ICD-10-CM

## 2012-11-03 NOTE — Progress Notes (Signed)
Subjective:  Sean Callahan is a 47 y.o. male who presents for recheck on knot in groin.  He was doing a monthly testicular exam about a month ago and noticed a hard nodule that he has not felt before . This got him worried.   Otherwise, no symptoms.  No fever, weight loss, nausea, pain, or swelling.  Not sure if this is a hernia or not.   At last visit we sent him for ultrasound.  He is here as the lump seems to have gotten bigger in the last few days causing mild discomfort.  No other aggravating or relieving factors.    No other c/o.  The following portions of the patient's history were reviewed and updated as appropriate: allergies, current medications, past family history, past medical history, past social history, past surgical history and problem list.  ROS Otherwise as in subjective above  Objective: Physical Exam  Vital signs reviewed  General appearance: alert, no distress, WD/WN GU: right groin, primarily deep adjacent to right side of base of penis with round 1.5-2cm round hard well defined mass that seems to be anterior to the spermatic cord.  No other nodules, mass, hernia or other abnormal finding noted.  otherwise circumcised, no swelling, no rash, no induration, no fluctuance.  No lymphadenopathy.  ULTRASOUND OF SCROTUM 10/01/11 Technique: Complete ultrasound examination of the testicles,  epididymis, and other scrotal structures was performed.  Comparison: None.  Findings:  Right testis: Normal. 4.5 x 2.3 x 3.7 cm. There is perfusion to  the testicle.  Left testis: Normal. 4.9 x 2.3 x 3.5 cm. There is perfusion to  the testicle.  Right epididymis: Normal in size and appearance.  Left epididymis: Normal in size and appearance.  Hydrocele: Tiny bilateral hydroceles.  Varicocele: None  There is a small complex well defined 1.0 x 0.8 x 0.9 cm  inhomogeneous nodule in the subcutaneous fat at the base of the  penis just to the right of midline. There is no perfusion in this   area.  The this could represent a small lymph node although the appearance  is atypical. The nodule was not tender and therefore I do not  think represents an abscess.  IMPRESSION:  1. Small well-defined nodule in the soft tissues at the base of  the penis is nonspecific. It probably represents a tiny lymph  node. I advised the patient that if this area increases in size it  could be further evaluated.  2. Normal appearing testicles.  3. Tiny bilateral hydroceles.   Assessment: Encounter Diagnoses  Name Primary?  . Groin mass Yes  . Need for prophylactic vaccination and inoculation against influenza     Plan: Reviewed the recent ultrasound results.  Discussed case with Dr. Susann Givens, supervising physician who also examined him.  The nodule seems benign, but ultrasound was inconclusive, thus referral to urology.  Counseled on the influenza virus vaccine.  Vaccine information sheet given.  Influenza vaccine given after consent obtained. Follow up: referral to urology for another opinion

## 2012-11-03 NOTE — Telephone Encounter (Signed)
Message copied by Janeice Robinson on Tue Nov 03, 2012  1:46 PM ------      Message from: Jac Canavan      Created: Tue Nov 03, 2012 12:35 PM       Refer to urology for pelvic lump 2wk from now.   [place] 2 [place].  ------

## 2012-11-03 NOTE — Telephone Encounter (Signed)
I fax his referral over to [facility] and they will schedule and fax the appointment back. CLS

## 2013-02-22 ENCOUNTER — Telehealth: Payer: Self-pay | Admitting: Medical

## 2013-02-22 ENCOUNTER — Ambulatory Visit: Payer: BC Managed Care – PPO | Admitting: Medical

## 2013-02-22 NOTE — Telephone Encounter (Signed)
Are we referring to the groin issue that he saw Urology for?  I unfortunately don't know any Urologist personally in [place]

## 2013-02-22 NOTE — Telephone Encounter (Signed)
The patient states that he was talking about the EKG and a physician in [place] for that. I explain to him that Kristian CoveyShane Tysinger PA-C is un aware of any physicians in the [place] area. CLS

## 2013-02-23 ENCOUNTER — Ambulatory Visit (INDEPENDENT_AMBULATORY_CARE_PROVIDER_SITE_OTHER): Payer: BC Managed Care – PPO | Admitting: Medical

## 2013-02-23 ENCOUNTER — Encounter: Payer: Self-pay | Admitting: Medical

## 2013-02-23 VITALS — BP 120/80 | HR 74 | Temp 97.4°F | Resp 18 | Wt 228.0 lb

## 2013-02-23 DIAGNOSIS — R0789 Other chest pain: Secondary | ICD-10-CM

## 2013-02-23 DIAGNOSIS — R55 Syncope and collapse: Secondary | ICD-10-CM

## 2013-02-23 DIAGNOSIS — R0602 Shortness of breath: Secondary | ICD-10-CM

## 2013-02-23 NOTE — Progress Notes (Signed)
Subjective:    Sean Callahan is a 48 y.o. male who presents for evaluation of chest discomfort.  He saw me back in July for similar symptoms.  At that time Dr. Susann Givens and I had discussed his symptoms, EKG, and he was also having GERD symptoms at the same time.  He had switched to Dexilant.  Since that visit he has had ongoing intermittent mild chest discomfort, occasional SOB, but given the scrotal mass that he had been dealing with that was ultimately an abscess that got drained by Urology, he sort of dismissed the chest discomfort until this past week.  He has continued to have intermittent chest discomfort, occasional SOB, dizziness, nothing too severe, but he notes 2 separate episodes this past week with syncope.  The first episode happened at home, he had gotten up from the couch, walk across the room, and suddenly collapsed. He was out for about 2 seconds. His wife apparently witnessed the syncope.  He felt mostly fine afterwards.  He says he may have been a little dizzy, but no nausea, no sweating, no chills, no prior unusual feeling other than maybe dizziness.  He went on about his day and had no other problems the rest the day.  He was at work the other day, had been walking doing things as usual, felt a sudden dizziness, and briefly felt faint, then quickly recovered again.  He denies any specific symptoms with exercise.  Denies skipping meals, drink plenty of water, he does tend overdo it with caffeine.  Drinks occasional alcohol beverage  He sees endocrinology for his diabetes, on Lantus and NovoLog insulin.  His last HgbA1c was around 8% just a few months ago up from 7% the last check.  Patient's cardiac risk factors are: diabetes mellitus, dyslipidemia, family history of premature cardiovascular disease, male gender and former 15 pack year smoking history but nonsmoker for many years now.    The following portions of the patient's history were reviewed and updated as appropriate: allergies,  current medications, past family history, past medical history, past social history, past surgical history and problem list.  Past Medical History  Diagnosis Date  . Diabetes mellitus   . ED (erectile dysfunction)   . Hyperlipidemia   . GERD (gastroesophageal reflux disease)      Review of Systems Constitutional: denies fever, chills, sweats, unexpected weight change, anorexia ENT: no runny nose, ear pain, sore throat, hoarseness, sinus pain, hearing loss, epistaxis Gastroenterology: denies abdominal pain,  vomiting, diarrhea, constipation,  Hematology: denies bleeding or bruising problems Musculoskeletal: denies arthralgias, myalgias, joint swelling, back pain, neck pain, cramping, gait changes Ophthalmology: denies vision changes Urology: denies dysuria, difficulty urinating, hematuria, urinary frequency, urgency, incontinence Neurology: no headache, weakness, tingling, numbness, speech abnormality, memory loss     Objective:     BP 120/80  Pulse 74  Temp(Src) 97.4 F (36.3 C) (Oral)  Resp 18  Wt 228 lb (103.42 kg)  General appearance: alert, no distress, WD/WN, male  HEENT: normocephalic, conjunctiva/corneas normal, sclerae anicteric, PERRLA, EOMi, nares patent, no discharge or erythema, pharynx normal Neck: supple, no lymphadenopathy, no thyromegaly, no JVD, no masses Chest: non tender, normal shape and expansion Heart: although he remained asymptomatic today, his pulse had a few brief seconds of tachycardia/seemed ectopic on a few occasions, but mostly RRR, normal S1, S2, no murmurs Lungs: CTA bilaterally, no wheezes, rhonchi, or rales Abdomen: +bs, soft, non tender, non distended, no masses, no hepatomegaly, no splenomegaly, no bruits Extremities: no edema, no cyanosis,  no clubbing Pulses: 2+ symmetric, upper and lower extremities, normal cap refill Neurological: alert, oriented x 3, CN2-12 intact, non focal    Cardiographic  Adult ECG Report  Indication: chest  discomfort, syncope  Rate: 70 bpm  Rhythm: normal sinus rhythm  QRS Axis: 59 degrees  PR Interval: 172ms  QRS Duration: 86ms  QTc: 399ms  Conduction Disturbances: none  Other Abnormalities: none  Patient's cardiac risk factors are: diabetes mellitus, dyslipidemia and male gender.  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  Normal cardiac silhouette.      Assessment:   Encounter Diagnoses  Name Primary?  . Syncope Yes  . Chest discomfort   . SOB (shortness of breath)      Plan:   Given the current symptoms and 2 episodes of syncope, we will try to get him in with cardiology within the next few days.  For now I advise he continue to check his sugars, start checking blood pressures, cut back some on caffeine, no alcohol use right now, no exercise right now.  Consider not driving for now until we have some clarity.  He will discuss his symptoms with his wife, and she will call EMS if any other syncope episodes.  Answered his questions, and he agrees with plan.  He also understands to call EMS if chest pain, or other symptoms of acute coronary syndrome which we discussed.

## 2013-02-24 ENCOUNTER — Ambulatory Visit: Payer: BC Managed Care – PPO | Admitting: Cardiology

## 2013-02-24 ENCOUNTER — Encounter: Payer: Self-pay | Admitting: Cardiology

## 2013-02-24 ENCOUNTER — Telehealth: Payer: Self-pay

## 2013-02-24 ENCOUNTER — Ambulatory Visit (INDEPENDENT_AMBULATORY_CARE_PROVIDER_SITE_OTHER): Payer: BC Managed Care – PPO | Admitting: Cardiology

## 2013-02-24 VITALS — BP 124/69 | HR 84 | Ht 73.5 in | Wt 228.5 lb

## 2013-02-24 DIAGNOSIS — R079 Chest pain, unspecified: Secondary | ICD-10-CM

## 2013-02-24 DIAGNOSIS — R55 Syncope and collapse: Secondary | ICD-10-CM

## 2013-02-24 DIAGNOSIS — E785 Hyperlipidemia, unspecified: Secondary | ICD-10-CM

## 2013-02-24 DIAGNOSIS — R9431 Abnormal electrocardiogram [ECG] [EKG]: Secondary | ICD-10-CM

## 2013-02-24 DIAGNOSIS — I471 Supraventricular tachycardia: Secondary | ICD-10-CM

## 2013-02-24 DIAGNOSIS — E109 Type 1 diabetes mellitus without complications: Secondary | ICD-10-CM

## 2013-02-24 NOTE — Patient Instructions (Signed)
Your physician has recommended that you wear a 48 hr holter monitor. Holter monitors are medical devices that record the heart's electrical activity. Doctors most often use these monitors to diagnose arrhythmias. Arrhythmias are problems with the speed or rhythm of the heartbeat. The monitor is a small, portable device. You can wear one while you do your normal daily activities. This is usually used to diagnose what is causing palpitations/syncope (passing out).  Your physician has requested that you have an echocardiogram. Echocardiography is a painless test that uses sound waves to create images of your heart. It provides your doctor with information about the size and shape of your heart and how well your heart's chambers and valves are working. This procedure takes approximately one hour. There are no restrictions for this procedure.   Your physician has requested that you have an exercise tolerance test. For further information please visit [email or web address]. Please also follow instruction sheet, as given.  Please drink 10-12 cups of water each day.    Please perform the leaning exercises as discussed during your visit today.  Your physician recommends that you schedule a follow-up appointment in: 2 weeks.

## 2013-02-24 NOTE — Telephone Encounter (Signed)
Dr. Herbie BaltimoreHarding ordered a 48 hr monitor on this gentleman. [place] is supposed to be back in town next Wednesday. He is also scheduled for an ECHO and a GXT in the office and wants to make sure he is not wearing the holter during these test.   Faxed a form for a 48 hour monitor to [facility] to be placed between 03/04/2013 and 03/10/2013.

## 2013-02-26 DIAGNOSIS — R9431 Abnormal electrocardiogram [ECG] [EKG]: Secondary | ICD-10-CM | POA: Insufficient documentation

## 2013-02-26 DIAGNOSIS — I471 Supraventricular tachycardia: Secondary | ICD-10-CM | POA: Insufficient documentation

## 2013-02-26 DIAGNOSIS — R55 Syncope and collapse: Secondary | ICD-10-CM | POA: Insufficient documentation

## 2013-02-26 DIAGNOSIS — R079 Chest pain, unspecified: Secondary | ICD-10-CM | POA: Insufficient documentation

## 2013-02-26 NOTE — Assessment & Plan Note (Signed)
This vague chest discomfort isn't describing for months now as somewhat unusual, that is always usually at rest and not with exertion.  Plan: Exercise Tolerance Test (Treadmill) - to evaluate for heart rate response, and possible ischemia.

## 2013-02-26 NOTE — Assessment & Plan Note (Signed)
Exam findings concerning for PSVT.  Will order 48 hour monitor to monitor for any arrhythmias. He may need longer evaluation in which case we may consider either 30 day monitor versus loop recorder.

## 2013-02-26 NOTE — Assessment & Plan Note (Signed)
I cannot be certain as to etiology of these episodes. I asked my nursing staff to check orthostatics, and each are now relatively well with no significant changes.  Indeed that is what his episode sounds like. He tells me that he had some of these episodes as an adolescent. He thought that he grew out of it.  However, with the finding on exam of rapid heartbeat, we cannot discount the possibility of an SVT.   Plan: 48 hour monitor to evaluate for arrhythmia   2-D echocardiogram to look for structural abnormalities   If findings of the initial evaluation are negative, will consider tilt table testing   I counseled him to ensure that he stays fully hydrated, drinking 10-12 cups (8 ounce) per day  I also educated him with itching and some helpful he has orthostatic hypotension.

## 2013-02-26 NOTE — Progress Notes (Signed)
PATIENT: Sean SchaumannDavid Dace MRN: [id number] DOB: 07/04/1965 PCP: Carollee HerterLALONDE,JOHN CHARLES, MD  Clinic Note: Chief Complaint  Patient presents with  . other    Ref by PCP c/o cp, sob, dizziness and palpitations. Meds reviewed verbally with pt.    HPI: Sean Callahan is a 48 y.o. male with a PMH below who presents today for evaluation of near syncopal episode with a conglomeration of her symptoms. He has a history of hypertension dyslipidemia, diabetes on insulin, erectile dysfunction along with a family history of premature CAD.  Interval History: Onalee HuaDavid says that starting about June of this past year he's been having some strange episodes of tightness in his chest associated with dizziness and lightheadedness. This usually happens when he's just sitting in place not doing anything. He describes as a constricted feeling in his chest it never really goes away totally. It's been there pretty much since June. He says it has times when it does become easier to ignore. He was concerned it may be GERD, and his PCP started him on GI, that seemed to help initially. However, he is now starting to have symptoms again. Prior to the onset of these symptoms back in June, he was very active and was able to on 3-4 miles at a time without problems. Now he notes that he gets a little short of breath in, has been scared to do much in the way of exercise over the last few months. About 3 weeks ago he had an episode of and is finally starting to get back to his baseline, and is really been noticing the old symptoms again even worse. The episode in question however was last week when he is sitting on his couch. He started noticing some chest tightness last feeling dizziness. He got up and start walking and came close to passing out. He described feeling very lightheaded and foggy feeling with this as the lites closing off. He doesn't recall he actually did pass out, if he did it was for maybe one or 2 seconds - his PCP he notes that  his wife witnessed this and commented that he passed out. He was only about an hour after his last meal, but did not take his blood sugar levels. He denied feeling nauseated her any diaphoresis associated with it. He recovered relatively quickly and felt relatively well the rest of the day. He then had another episode while at work that was similar with dizziness and feeling faint but did not fully pass out.  He truly denies any sensation of true exertional chest discomfort, but has it at rest. He doesn't say that it gets worse with exertion. With the episode of feeling lightheaded and passing out out he denies any any sensation of palpitations or irregular heartbeats. This is despite the fact it is primary provider has noted irregular heartbeats on exam. Says he does feel a couple skipped beats here and there but this is been going on for years. No rapid irregular heartbeats or anything that he would have sensation.  He says that he may be 2 large cups of coffee in the course of the day.  He also drinks occasional unsweetened Tea.  He has maybe one or 2 Coke or Pepsi products a day. He says his blood sugars are usually well controlled.  The remainder of cardiac review of systems is as follows: Cardiovascular ROS: negative for - chest pain, edema, murmur, orthopnea, paroxysmal nocturnal dyspnea, rapid heart rate, shortness of breath or No TIA or amaurosis  fugax-type symptoms. No melena, hematochezia or hematuria. No claudication. :  Past Medical History  Diagnosis Date  . Diabetes mellitus   . ED (erectile dysfunction)   . Hyperlipidemia   . GERD (gastroesophageal reflux disease)     Prior Cardiac Evaluation and Past Surgical History: Past Surgical History  Procedure Laterality Date  . Ankle surgery    . Tonsillectomy    . Abscess drainage    . Vasectomy      No Known Allergies  Current Outpatient Prescriptions  Medication Sig Dispense Refill  . aspirin 81 MG tablet Take 81 mg by mouth  daily.      Marland Kitchen emollient (BIAFINE) cream Apply topically as needed.      Marland Kitchen glucagon (GLUCAGON EMERGENCY) 1 MG injection Inject 1 mg into the vein as needed.      . insulin aspart (NOVOLOG) 100 UNIT/ML injection Inject 10-15 Units into the skin 3 (three) times daily before meals.       . insulin glargine (LANTUS) 100 UNIT/ML injection Inject 18 Units into the skin 2 (two) times daily.      Marland Kitchen losartan (COZAAR) 25 MG tablet Take 25 mg by mouth daily.      Marland Kitchen omeprazole (PRILOSEC) 20 MG capsule Take 20 mg by mouth daily.      . sildenafil (VIAGRA) 100 MG tablet 1 tablet po daily prn  30 tablet  3  . simvastatin (ZOCOR) 40 MG tablet Take 40 mg by mouth every evening.       No current facility-administered medications for this visit.    History   Social History Narrative  . No narrative on file    family history includes Cancer (age of onset: 58) in his father; Heart attack in his father; Heart disease (age of onset: 56) in his father.  ROS: A comprehensive Review of Systems - Negative except Symptoms noted above. He denies any wheezing or coughing or recent illnesses besides the food poisoning. With no additional vomiting or nausea or diarrhea. No dysuria or hematuria. at one time this past fall he was taking twice a day aspirin. When he was doing it he noted mild blood in his stool away since he cut back to once daily aspirin. No recent fevers, chills or sweats. No weight change.  PHYSICAL EXAM BP 124/69  Pulse 84  Ht 6' 1.5" (1.867 m)  Wt 228 lb 8 oz (103.647 kg)  BMI 29.74 kg/m2 General appearance: alert, cooperative, appears stated age, no distress and Borderline obese. Well-nourished and well-groomed. Stress/pressured mood. Very talkative and descriptive. Neck: no adenopathy, no carotid bruit, no JVD, supple, symmetrical, trachea midline and thyroid not enlarged, symmetric, no tenderness/mass/nodules Lungs: clear to auscultation bilaterally, normal percussion bilaterally and Nonlabored,  good air movement Heart: Mostly RRR with normal S1-S2. No M./R./G. Interestingly, he had a bout of 15-20 rapid regular beats during my exam did she did not feel. Abdomen: soft, non-tender; bowel sounds normal; no masses,  no organomegaly Extremities: extremities normal, atraumatic, no cyanosis or edema Pulses: 2+ and symmetric Skin: Skin color, texture, turgor normal. No rashes or lesions Neurologic: Alert and oriented X 3, normal strength and tone. Normal symmetric reflexes. Normal coordination and gait  [id number] today: Yes Rate: 78 , Rhythm: NSR inverted T waves in inferior leads, suggestive of potentially RVH, cannot rule out inferior ischemia to  Recent Labs: None available  ASSESSMENT / PLAN: Interesting situation a 48 year old gentleman with any irregular sensation in the chest that is nonexertional, and now with  one episode of what sounds like maybe to syncope and another of near syncope. He does not have any sensation of palpitations, however on exam he definitely had a rapid arrhythmia that sounded like an SVT.  No problem-specific assessment & plan notes found for this encounter.   Orders Placed This Encounter  Procedures  . EKG 12-Lead    Order Specific Question:  Where should this test be performed    Answer:  [facility]  . Holter monitor - 48 hour    Standing Status: Future     Number of Occurrences:      Standing Expiration Date: 02/24/2014    Scheduling Instructions:     [facility] to contact pt regarding 48 hr monitor placement    Order Specific Question:  Where should this test be performed    Answer:  [facility]  . Exercise Tolerance Test    Standing Status: Future     Number of Occurrences:      Standing Expiration Date: 02/24/2014    Order Specific Question:  Where should this test be performed    Answer:  [facility]  . 2D Echocardiogram without contrast    Standing Status: Future     Number of Occurrences:      Standing Expiration Date:  02/24/2014    Order Specific Question:  Type of Echo    Answer:  Complete    Order Specific Question:  Where should this test be performed    Answer:  [facility]    Order Specific Question:  Reason for exam-Echo    Answer:  Syncope  780.2    Order Specific Question:  Reason for exam-Echo    Answer:  Abnormal Heart Sounds Nec  785.3   Meds ordered this encounter  Medications  . glucagon (GLUCAGON EMERGENCY) 1 MG injection    Sig: Inject 1 mg into the vein as needed.  Marland Kitchen emollient (BIAFINE) cream    Sig: Apply topically as needed.    Followup: 2 weeks   Marykay Lex, M.D., M.S. [facility]  Pager # [phone]  02/26/2013 11:36 PM

## 2013-02-26 NOTE — Assessment & Plan Note (Signed)
EKG would suggest possible RVH versus ischemic changes. Will check 2-D echocardiogram to look for any structural abnormalities.

## 2013-03-04 ENCOUNTER — Other Ambulatory Visit (INDEPENDENT_AMBULATORY_CARE_PROVIDER_SITE_OTHER): Payer: BC Managed Care – PPO

## 2013-03-04 ENCOUNTER — Other Ambulatory Visit: Payer: Self-pay

## 2013-03-04 DIAGNOSIS — R55 Syncope and collapse: Secondary | ICD-10-CM

## 2013-03-04 DIAGNOSIS — R9431 Abnormal electrocardiogram [ECG] [EKG]: Secondary | ICD-10-CM

## 2013-03-04 DIAGNOSIS — R079 Chest pain, unspecified: Secondary | ICD-10-CM

## 2013-03-04 DIAGNOSIS — I471 Supraventricular tachycardia: Secondary | ICD-10-CM

## 2013-03-10 ENCOUNTER — Ambulatory Visit (INDEPENDENT_AMBULATORY_CARE_PROVIDER_SITE_OTHER): Payer: BC Managed Care – PPO | Admitting: Cardiology

## 2013-03-10 ENCOUNTER — Encounter: Payer: Self-pay | Admitting: Cardiology

## 2013-03-10 VITALS — BP 118/80 | HR 79 | Ht 73.0 in | Wt 232.8 lb

## 2013-03-10 DIAGNOSIS — R079 Chest pain, unspecified: Secondary | ICD-10-CM

## 2013-03-10 DIAGNOSIS — R9431 Abnormal electrocardiogram [ECG] [EKG]: Secondary | ICD-10-CM

## 2013-03-10 DIAGNOSIS — R55 Syncope and collapse: Secondary | ICD-10-CM

## 2013-03-10 DIAGNOSIS — I471 Supraventricular tachycardia: Secondary | ICD-10-CM

## 2013-03-10 NOTE — Procedures (Signed)
Exercise Treadmill Test  Pre-Exercise Testing Evaluation Rhythm: normal sinus  Rate: 82   PR:  normal QRS:  Normal  QT:  normal QTc: Normal   P axis: Normal  QRS axis:  +60 degrees  ST Segments:  no significant ST changes at rest     Test  Exercise Tolerance Test Ordering MD: Marykay LexHARDING,Dewaun W, MD  Interpreting MD: Marykay LexHARDING,Chivas W, MD   Unique Test No: n/a  Treadmill:  1  Indication for ETT: chest pain - rule out ischemia  Contraindication to ETT: No   Stress Modality: exercise - treadmill  Cardiac Imaging Performed: non   Protocol: standard Bruce - maximal  Max BP:   178/80   Max MPHR (bpm):   174  85% MPR (bpm):   147   MPHR obtained (bpm):  169  % MPHR the obtained:  97   Reached 85% MPHR (min:sec):  9:30  Total Exercise Time (min-sec):   11 min   Workload in METS:   12.9  Borg Scale:  4  Reason ETT Terminated:  unable to continue walking at that rate without running    ST Segment Analysis At Rest: normal ST segments - no evidence of significant ST depression With Exercise: no evidence of significant ST depression  Other Information Arrhythmia:  Rare PVCs noted at higher heart rates, at least one pair Angina during ETT:  absent (0) Quality of ETT:  diagnostic  ETT Interpretation:  normal - no evidence of ischemia by ST analysis  Comments: Excellent exercise tolerance for age. Excellent recovery with heart rate decrease in the first minute of 20 bpm.  Rare PVCs noted at peak exercise, likely related to hyperdynamic effect.   Duke TM Score = + 11; very low risk  Recommendations: The patient is fine for returning back to his base [place]. exercise. Very low likelihood for any ischemic coronary disease.    Marykay LexHARDING,Rodolphe W, M.D., M.S. [facility] [address]; [address] [place], [place]  [id number] Phone:  [phone]

## 2013-03-10 NOTE — Progress Notes (Signed)
    Patient ID: Sean SchaumannDavid Popelka, male   DOB: Jul 06, 1965, 48 y.o.   MRN: [id number]  The patient is presenting here today for an  Exercise Tolerance Test. Indications: Chest pain - Plan: Exercise Tolerance Test  Abnormal EKG - Plan: Exercise Tolerance Test  Syncope - Plan: Exercise Tolerance Test  PSVT (paroxysmal supraventricular tachycardia) - Plan: Exercise Tolerance Test   We reviewed the results of his echocardiogram, and proceeded with the treadmill test. He is doing fine without any recurrence of syncopal or near-syncopal episodes.  Following this test, he is scheduled to be set up with an event monitor that he will wear for couple weeks. He'll then be followed up in the clinic after wearing the monitor.  Marykay LexHARDING,Tejuan W, MD

## 2013-03-10 NOTE — Patient Instructions (Signed)
Stress test was normal.  Your physician recommends that you schedule a follow-up appointment in:  After your holter monitor results come back

## 2013-04-01 ENCOUNTER — Other Ambulatory Visit: Payer: Self-pay

## 2013-04-01 ENCOUNTER — Encounter (INDEPENDENT_AMBULATORY_CARE_PROVIDER_SITE_OTHER): Payer: BC Managed Care – PPO

## 2013-04-01 DIAGNOSIS — R079 Chest pain, unspecified: Secondary | ICD-10-CM

## 2013-04-01 DIAGNOSIS — I471 Supraventricular tachycardia: Secondary | ICD-10-CM

## 2013-04-01 DIAGNOSIS — R55 Syncope and collapse: Secondary | ICD-10-CM

## 2013-04-01 DIAGNOSIS — R9431 Abnormal electrocardiogram [ECG] [EKG]: Secondary | ICD-10-CM

## 2013-07-07 ENCOUNTER — Institutional Professional Consult (permissible substitution): Payer: BC Managed Care – PPO | Admitting: Medical

## 2013-07-08 ENCOUNTER — Encounter: Payer: Self-pay | Admitting: Medical

## 2013-07-08 ENCOUNTER — Ambulatory Visit (INDEPENDENT_AMBULATORY_CARE_PROVIDER_SITE_OTHER): Payer: BC Managed Care – PPO | Admitting: Medical

## 2013-07-08 DIAGNOSIS — Z7184 Encounter for health counseling related to travel: Secondary | ICD-10-CM

## 2013-07-08 DIAGNOSIS — Z23 Encounter for immunization: Secondary | ICD-10-CM

## 2013-07-08 DIAGNOSIS — Z7189 Other specified counseling: Secondary | ICD-10-CM

## 2013-07-08 DIAGNOSIS — E119 Type 2 diabetes mellitus without complications: Secondary | ICD-10-CM

## 2013-07-08 NOTE — Progress Notes (Signed)
   Subjective:   Sean Callahan is a 49 y.o. male presenting on 07/08/2013 with Immunizations  Here today to discuss vaccines. He does not have his complete vaccine record available at this time although he has it somewhere stowed away at home.  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  He does not feel that he will be doing any adventure travel or being in remote areas dealing with animals.  His girlfriend and her 15yo son will be going to.  In general he feels fine without complaint, usual state of health. He is a diabetic, sees endocrinology.  No other complaint.  Review of Systems ROS as in subjective      Objective:   General appearance: alert, no distress, WD/WN       Assessment: Encounter Diagnoses  Name Primary?  . Type II or unspecified type diabetes mellitus without mention of complication, not stated as uncontrolled Yes  . Travel advice encounter   . Need for prophylactic vaccination against Streptococcus pneumoniae (pneumococcus)   . Need for prophylactic vaccination and inoculation against viral hepatitis      Plan: Discussed his travel concerns, vaccine questions.  He will be traveling to mostly industrialized modern areas in [place], and [place].  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  He declines any vaccine titers today.  He is up-to-date on influenza and Tdap per our records.  He was interested in going ahead with certain vaccine today as below  Counseled on the Hepatitis A virus vaccine.  Vaccine information sheet given.  Hepatitis A vaccine given after consent obtained.  Patient was advised to return in 6 months for  Hep A #2.  Counseled on the Hepatitis B virus vaccine.  Vaccine information sheet given.  Hepatitis B vaccine given after consent obtained.  Patient was advised to return in 2 months for Hep B #2, and in 6 months for Hep B #3.    Counseled on the pneumococcal vaccine.  Vaccine information sheet given.  Pneumococcal vaccine given after consent obtained.  Gave other general counseling on foreign travel, advise he check the CDC website for specific country travel information  [place] was seen today for immunizations.  Diagnoses and associated orders for this visit:  Type II or unspecified type diabetes mellitus without mention of complication, not stated as uncontrolled  Travel advice encounter  Need for prophylactic vaccination against Streptococcus pneumoniae (pneumococcus)  Need for prophylactic vaccination and inoculation against viral hepatitis     Return return 33mo for Hep B 2,  55mo for Hep A 2.

## 2013-09-12 IMAGING — US US SCROTUM
1 series · 13 of 25 positions shown · non-contrast
Comparison: None.

CLINICAL DATA: Palpable nonpainful mass at the base of the penis.

ULTRASOUND OF SCROTUM
TECHNIQUE: Complete ultrasound examination of the testicles,
epididymis, and other scrotal structures was performed.

[Series 1: us scrotum · 0.06mm/px · 13 of 49 slices shown]
[im 1/49]
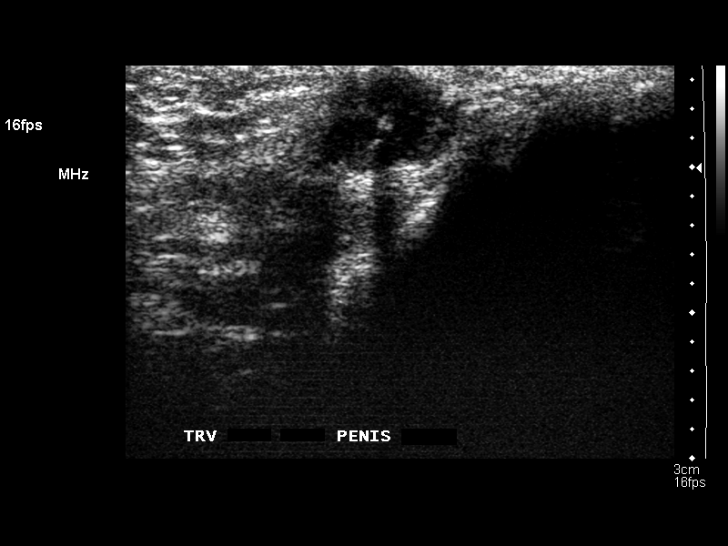
[im 5/49]
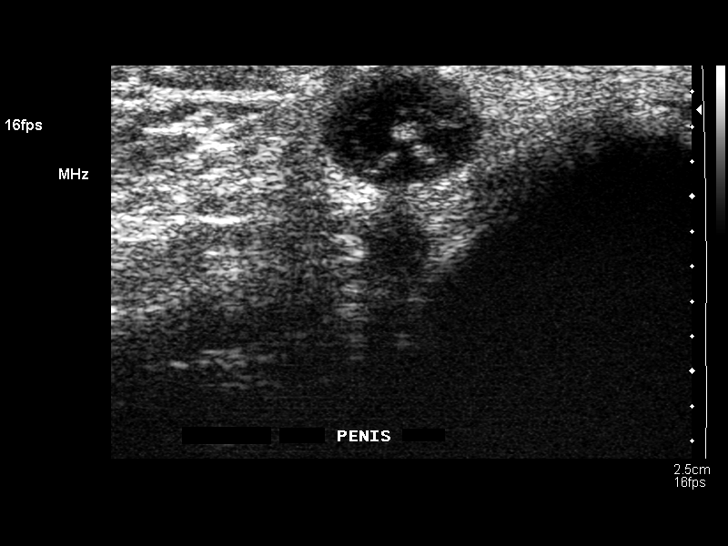
[im 9/49]
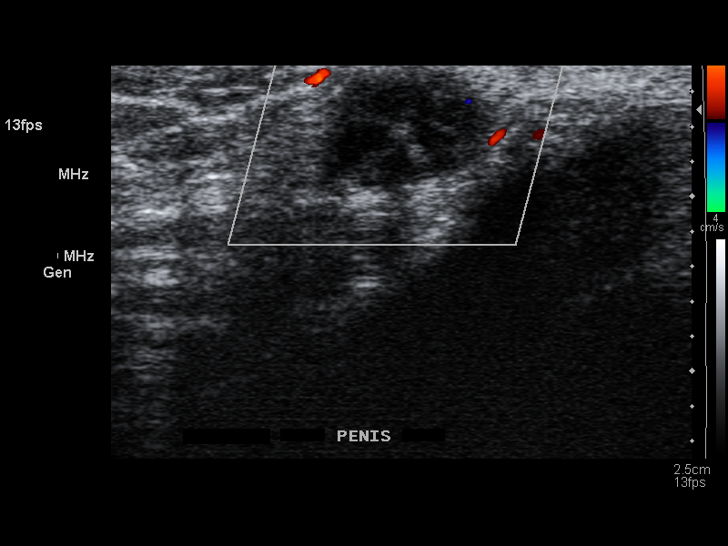
[im 13/49]
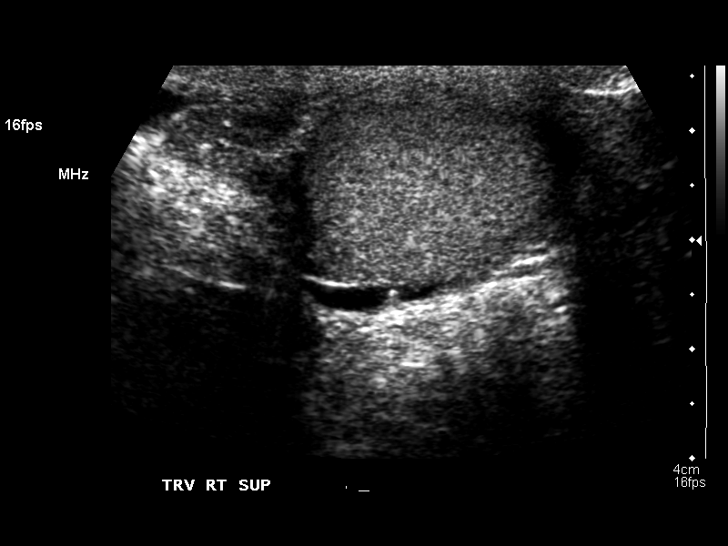
[im 17/49]
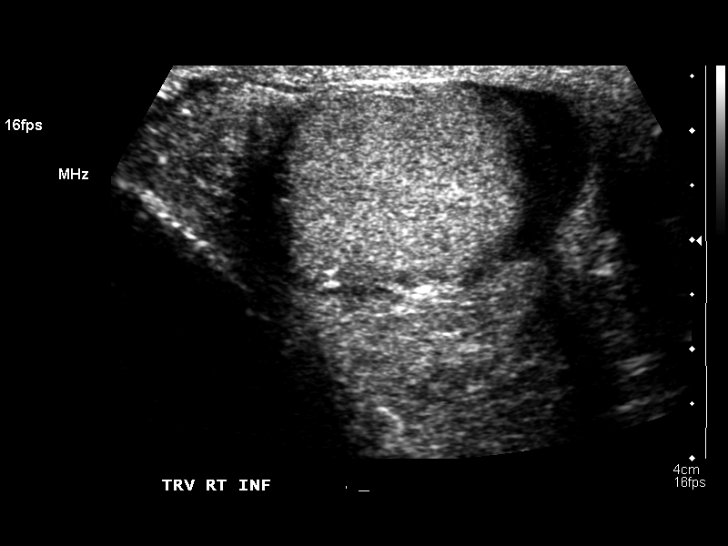
[im 21/49]
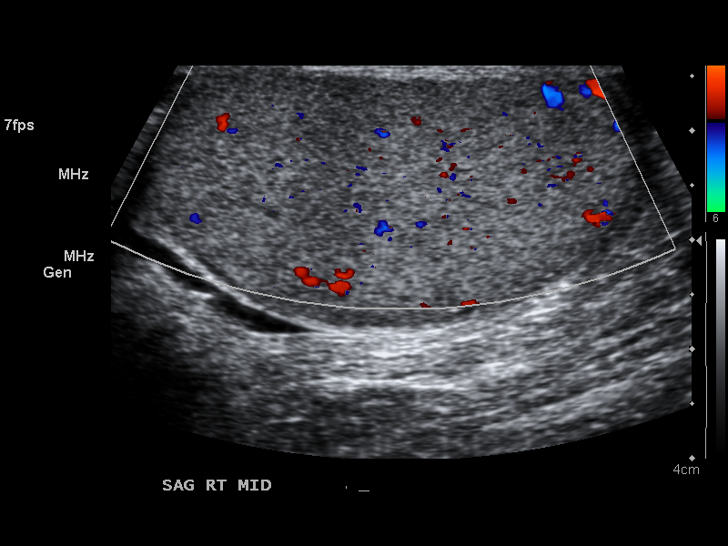
[im 25/49]
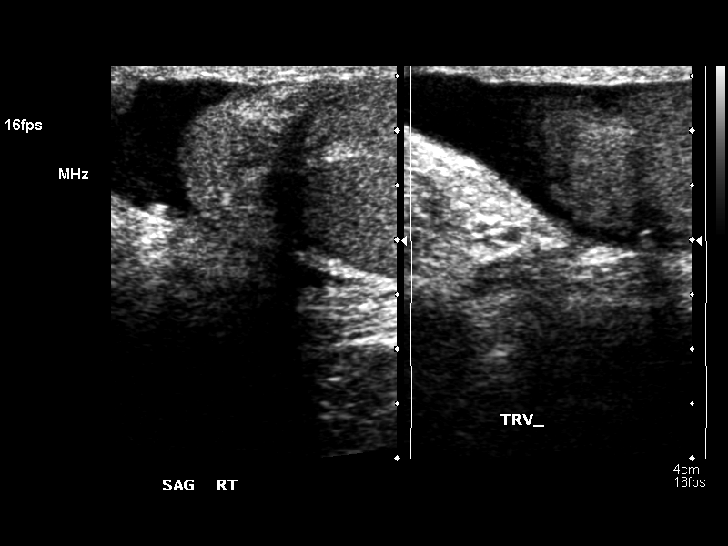
[im 29/49]
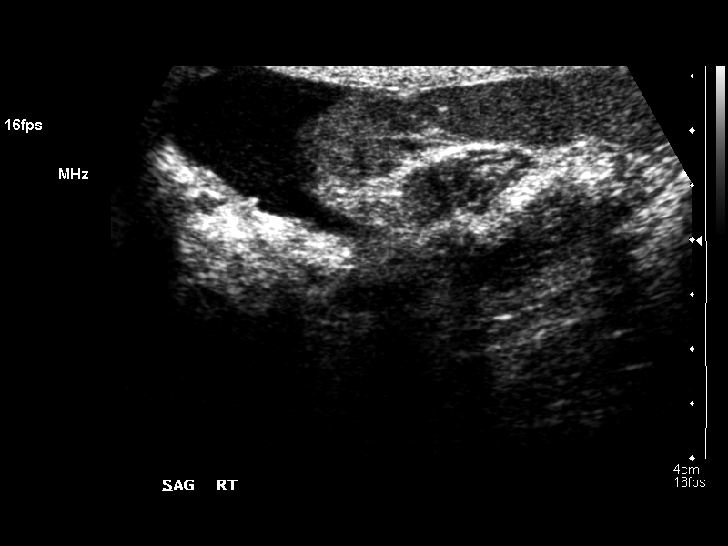
[im 33/49]
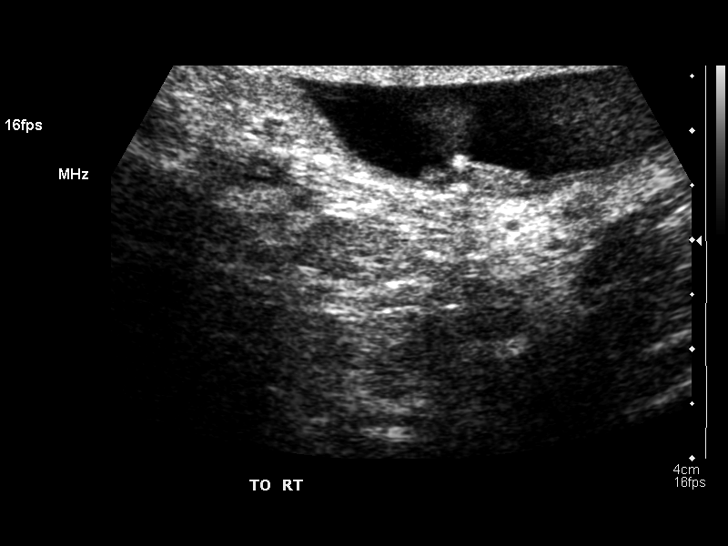
[im 37/49]
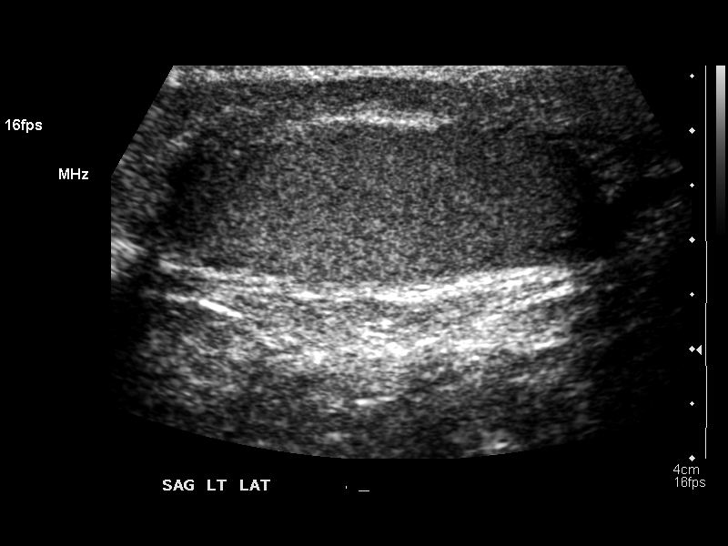
[im 41/49]
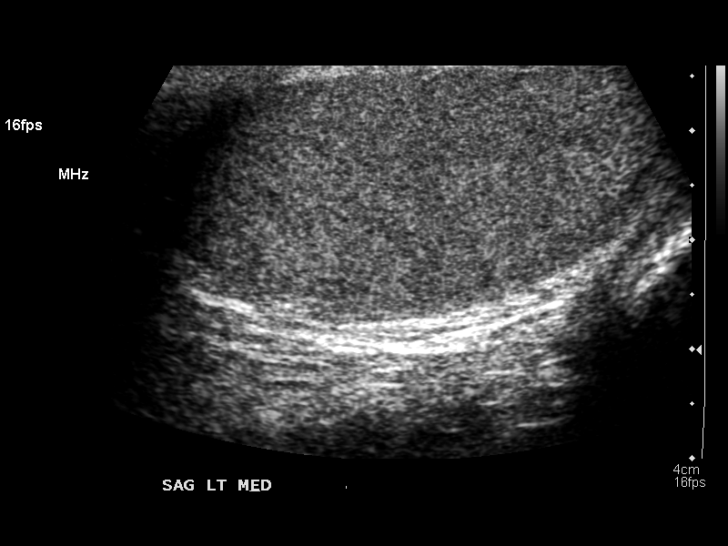
[im 45/49]
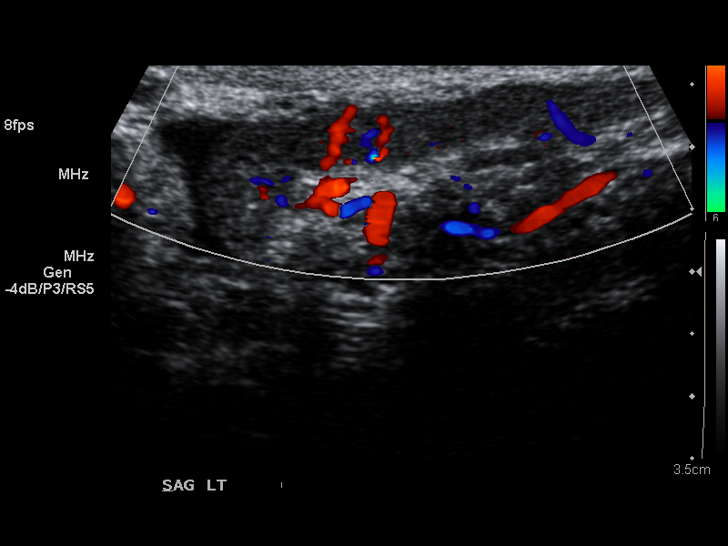
[im 49/49]
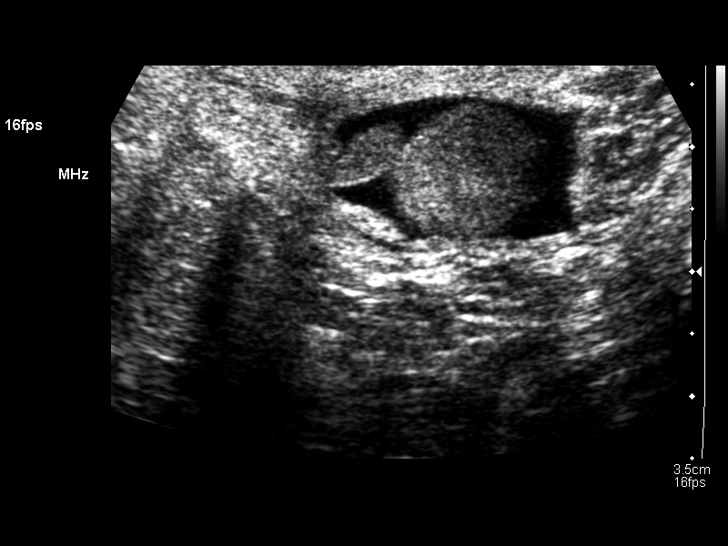

[13 of 25 positions shown; findings below may reference images not displayed]

FINDINGS: Right testis:  Normal.  4.5 x 2.3 x 3.7 cm.  There is perfusion to
the testicle.

Left testis:  Normal.  4.9 x 2.3 x 3.5 cm.  There is perfusion to
the testicle.

Right epididymis:  Normal in size and appearance.

Left epididymis:  Normal in size and appearance.

Hydrocele:  Tiny bilateral hydroceles.

Varicocele:  None

There is a small complex well defined 1.0 x 0.8 x 0.9 cm
inhomogeneous nodule in the subcutaneous fat at the base of the
penis just to the right of midline.  There is no perfusion in this
area.

The this could represent a small lymph node although the appearance
is atypical.  The nodule was not tender and therefore I do not
think represents an abscess.
IMPRESSION: 1.  Small well-defined nodule in the soft tissues at the base of
the penis is nonspecific.  It probably represents a tiny lymph
node.  I advised the patient that if this area increases in size it
could be further evaluated.
2.  Normal appearing testicles.
3.  Tiny bilateral hydroceles.

## 2013-10-14 ENCOUNTER — Telehealth: Payer: Self-pay | Admitting: Internal Medicine

## 2013-10-14 ENCOUNTER — Other Ambulatory Visit (INDEPENDENT_AMBULATORY_CARE_PROVIDER_SITE_OTHER): Payer: BC Managed Care – PPO

## 2013-10-14 ENCOUNTER — Other Ambulatory Visit: Payer: Self-pay | Admitting: Medical

## 2013-10-14 DIAGNOSIS — Z23 Encounter for immunization: Secondary | ICD-10-CM

## 2013-10-14 MED ORDER — SILDENAFIL CITRATE 100 MG PO TABS: ORAL_TABLET | ORAL | Status: DC

## 2013-10-14 NOTE — Telephone Encounter (Signed)
Pt needs a refill on viagra. Pt is coming in this morning for some shots and would like to pick up the rx then as he sends the rx to the pharmacy himself.

## 2013-10-14 NOTE — Telephone Encounter (Signed)
Will give to pt when he comes in

## 2013-10-14 NOTE — Telephone Encounter (Signed)
done

## 2013-11-12 ENCOUNTER — Other Ambulatory Visit: Payer: Self-pay

## 2014-02-14 ENCOUNTER — Other Ambulatory Visit: Payer: BC Managed Care – PPO

## 2014-04-08 ENCOUNTER — Telehealth: Payer: Self-pay | Admitting: Medical

## 2014-04-08 NOTE — Telephone Encounter (Signed)
Pt called for refill of Glucagon to [facility]

## 2014-04-11 ENCOUNTER — Other Ambulatory Visit: Payer: Self-pay | Admitting: Medical

## 2014-04-11 MED ORDER — GLUCAGON (RDNA) 1 MG IJ KIT: 1.0000 mg | PACK | Freq: Once | INTRAMUSCULAR | Status: DC | PRN

## 2014-07-12 ENCOUNTER — Ambulatory Visit (INDEPENDENT_AMBULATORY_CARE_PROVIDER_SITE_OTHER): Payer: BLUE CROSS/BLUE SHIELD | Admitting: Family Medicine

## 2014-07-12 ENCOUNTER — Encounter: Payer: Self-pay | Admitting: Family Medicine

## 2014-07-12 VITALS — BP 122/84 | HR 78 | Wt 225.0 lb

## 2014-07-12 DIAGNOSIS — T8089XA Other complications following infusion, transfusion and therapeutic injection, initial encounter: Secondary | ICD-10-CM

## 2014-07-12 NOTE — Progress Notes (Signed)
   Subjective:    Patient ID: Sean Callahan, male    DOB: Oct 23, 1965, 49 y.o.   MRN: [id number]  HPI He gave himself an insulin injection in the left upper arm in April head noted in next day to have some swelling in that area. He was seen in an urgent care center and they recommended conservative care. He has noted some decrease in the size of lesions since then but is still present. It causes no symptoms of any kind.  Review of Systems     Objective:   Physical Exam Exam of the left posterior shoulder area does show a mass effect but no discrete margins of approximately 3 x 5 cm. It is subcutaneous and not attached to the muscle.       Assessment & Plan:  Injection site irritation, initial encounter I explained that this is most likely an injection site plan laboratory response and recommended an tinea conservative care. He is comfortable with this.

## 2014-07-25 ENCOUNTER — Other Ambulatory Visit: Payer: Self-pay

## 2014-08-11 ENCOUNTER — Ambulatory Visit (INDEPENDENT_AMBULATORY_CARE_PROVIDER_SITE_OTHER): Payer: BLUE CROSS/BLUE SHIELD | Admitting: Medical

## 2014-08-11 ENCOUNTER — Encounter: Payer: Self-pay | Admitting: Medical

## 2014-08-11 VITALS — BP 112/60 | HR 80 | Temp 98.1°F | Resp 15 | Wt 228.0 lb

## 2014-08-11 DIAGNOSIS — N528 Other male erectile dysfunction: Secondary | ICD-10-CM | POA: Diagnosis not present

## 2014-08-11 DIAGNOSIS — G629 Polyneuropathy, unspecified: Secondary | ICD-10-CM

## 2014-08-11 DIAGNOSIS — E1342 Other specified diabetes mellitus with diabetic polyneuropathy: Secondary | ICD-10-CM

## 2014-08-11 DIAGNOSIS — R05 Cough: Secondary | ICD-10-CM | POA: Diagnosis not present

## 2014-08-11 DIAGNOSIS — J069 Acute upper respiratory infection, unspecified: Secondary | ICD-10-CM | POA: Diagnosis not present

## 2014-08-11 DIAGNOSIS — R059 Cough, unspecified: Secondary | ICD-10-CM

## 2014-08-11 DIAGNOSIS — E1142 Type 2 diabetes mellitus with diabetic polyneuropathy: Secondary | ICD-10-CM

## 2014-08-11 MED ORDER — SILDENAFIL CITRATE 100 MG PO TABS: ORAL_TABLET | ORAL | Status: DC

## 2014-08-11 NOTE — Progress Notes (Signed)
Subjective:  Sean Callahan is a 48 y.o. male who presents for cough, headache.  Symptoms include 3 days of cough, headache, over the last few days worsening cough, rolling in bed or lying gets rattly cough, chest pressure, persistent headache.  Last time with similar symptoms ended up with pneumonia.  Has some scratchy throat, some ear pressure.  Denies fever, NVD.  No leg swelling.  Treatment to date: Tylenol.  No sick contacts, but in the public a lot, traveling a lot.   He does not smoke.   He does have a history of bronchitis.   No other aggravating or relieving factors.    3 months ago, getting cold sensation in legs from calve down to feet.  Comes and goes.  More frequent though.  Is due at this time at [facility] for diabetes f/u.  Has hx/o diabetic neuropathy, was on gabapentin at one time, never seemed to work even at higher doses.  Denies claudication pain.  Needs refills on Viagra, does fine on current dose without c/o.  The following portions of the patient's history were reviewed and updated as appropriate: allergies, current medications, past family history, past medical history, past social history, past surgical history and problem list.  ROS as in subjective  Past Medical History  Diagnosis Date  . Diabetes mellitus   . ED (erectile dysfunction)   . Hyperlipidemia   . GERD (gastroesophageal reflux disease)      Objective: BP 112/60 mmHg  Pulse 80  Temp(Src) 98.1 F (36.7 C) (Oral)  Resp 15  Wt 228 lb (103.42 kg)  General appearance: Alert, WD/WN, no distress                             Skin: warm, no rash, no diaphoresis                           Head: no sinus tenderness                            Eyes: conjunctiva normal, corneas clear, PERRLA                            Ears: pearly TMs, external ear canals normal                          Nose: septum midline, turbinates swollen, with erythema and clear discharge             Mouth/throat: MMM, tongue normal, mild pharyngeal  erythema                           Neck: supple, no adenopathy, no thyromegaly, nontender                         Lungs: clear, no rhonchi, no wheezes, no rales                Extremities: no edema, nontender See separate foot exam     Assessment: Encounter Diagnoses  Name Primary?  . Acute upper respiratory infection Yes  . Cough   . Other male erectile dysfunction   . Diabetic peripheral neuropathy       Plan:  Discussed diagnosis and treatment of URI  Suggested symptomatic OTC remedies for cough and congestion.  Tylenol OTC for fever and malaise.  Call/return in 2-3 days if symptoms are worse or not improving.  Advised that cough may linger even after the infection is improved.     ED - c/t Viagra, refill give  diabetic neuropathy - discussed symptoms.  Advised diabetic socks, c/t routine exercise, walking, and [facility]

## 2014-08-19 ENCOUNTER — Ambulatory Visit (INDEPENDENT_AMBULATORY_CARE_PROVIDER_SITE_OTHER): Payer: BLUE CROSS/BLUE SHIELD | Admitting: Family Medicine

## 2014-08-19 ENCOUNTER — Encounter: Payer: Self-pay | Admitting: Family Medicine

## 2014-08-19 VITALS — BP 118/76 | HR 88 | Wt 224.0 lb

## 2014-08-19 DIAGNOSIS — Z8719 Personal history of other diseases of the digestive system: Secondary | ICD-10-CM | POA: Insufficient documentation

## 2014-08-19 DIAGNOSIS — K219 Gastro-esophageal reflux disease without esophagitis: Secondary | ICD-10-CM | POA: Diagnosis not present

## 2014-08-19 NOTE — Progress Notes (Signed)
   Subjective:    Patient ID: Sean Callahan, male    DOB: 01-03-1966, 49 y.o.   MRN: [id number]  HPI He was seen approximately one week ago and treated for a URI. He complains of continued difficulty with coughing however the cough is mainly occurring at night when he lies down. He's had no fever, chills, sore throat. Cough is nonproductive. He has a previous history of reflux disease and was on Prilosec for acid indigestion. He also has a history of esophageal stricture and apparently had a procedure done at the [place] for this.   Review of Systems     Objective:   Physical Exam Alert and in no distress. Tympanic membranes and canals are normal. Pharyngeal area is normal. Neck is supple without adenopathy or thyromegaly. Cardiac exam shows a regular sinus rhythm without murmurs or gallops. Lungs are clear to auscultation. Gastroesophageal reflux disease without esophagitis  History of esophageal stricture   rercommend using Prilosec at night. He will call me if continued difficulty.

## 2014-08-19 NOTE — Patient Instructions (Addendum)
Don't eat anything before bedtime. Use the omeprazole.  Take an extra dose right now

## 2015-10-17 ENCOUNTER — Ambulatory Visit (INDEPENDENT_AMBULATORY_CARE_PROVIDER_SITE_OTHER): Payer: BLUE CROSS/BLUE SHIELD | Admitting: Medical

## 2015-10-17 ENCOUNTER — Encounter: Payer: Self-pay | Admitting: Medical

## 2015-10-17 VITALS — BP 130/72 | HR 83 | Temp 98.0°F | Wt 228.0 lb

## 2015-10-17 DIAGNOSIS — G5623 Lesion of ulnar nerve, bilateral upper limbs: Secondary | ICD-10-CM

## 2015-10-17 DIAGNOSIS — G5603 Carpal tunnel syndrome, bilateral upper limbs: Secondary | ICD-10-CM | POA: Diagnosis not present

## 2015-10-17 DIAGNOSIS — R202 Paresthesia of skin: Secondary | ICD-10-CM | POA: Diagnosis not present

## 2015-10-17 NOTE — Progress Notes (Signed)
Subjective: Chief Complaint  Patient presents with  . Tingling    tingling sensation last 2 weeks, nonstop on right side but off and on left side   Here for paresthesias of hands.  He has known hx/o diabetes, hyperlipidemia, and 2013 had abnormal arm EMG/CNS showing CTS and ulnar nerve syndrome.   In 2013 was treatment for paresthesia, worse CTS splints, did gabapentin.  But for about 2 years the symptoms resolved.   In the last few weeks he and wife have moved to a new home, has been doing lots of boxing up things, moving furniture.  In last few weeks having numbness of both hands, tingling of both hands and forearm.   Started back using braces for CTS.  they are getting use to a new mattress.  Arms go numb during sleep.   No other injury, trauma.  No other aggravating or relieving factors. No other complaint.  Past Medical History:  Diagnosis Date  . Diabetes mellitus   . ED (erectile dysfunction)   . GERD (gastroesophageal reflux disease)   . Hyperlipidemia    Current Outpatient Prescriptions on File Prior to Visit  Medication Sig Dispense Refill  . aspirin 81 MG tablet Take 81 mg by mouth daily.    Marland Kitchen. emollient (BIAFINE) cream Apply topically as needed.    Marland Kitchen. glucagon (GLUCAGON EMERGENCY) 1 MG injection Inject 1 mg into the vein as needed.    . insulin aspart (NOVOLOG) 100 UNIT/ML injection Inject 10-15 Units into the skin 3 (three) times daily before meals.     . insulin glargine (LANTUS) 100 UNIT/ML injection Inject 18 Units into the skin 2 (two) times daily.    Marland Kitchen. losartan (COZAAR) 25 MG tablet Take 25 mg by mouth daily.    . sildenafil (VIAGRA) 100 MG tablet 1 tablet po daily prn 30 tablet 3  . simvastatin (ZOCOR) 40 MG tablet Take 40 mg by mouth every evening.     No current facility-administered medications on file prior to visit.    ROS as in subjective   Objective: BP 130/72   Pulse 83   Temp 98 F (36.7 C) (Oral)   Wt 228 lb (103.4 kg)   BMI 30.08 kg/m   General  appearance: alert, no distress, WD/WN  Neck: supple, no lymphadenopathy, no thyromegaly, no masses, normal ROM, nontender Heart: RRR, normal S1, S2, no murmurs Lungs: CTA bilaterally, no wheezes, rhonchi, or rales Pulses: 2+ symmetric, upper and lower extremities, normal cap refill MSK:  Tender right thumb over base of thumb at associated area of redness, otherwise hands and fingers and arms nontender, normal finger, wrist and arm ROM Neuro: normal wrist, finger, hand and arm strength , sensation, and DTRs. No arm and hand edema    Assessment: Encounter Diagnoses  Name Primary?  . Paresthesia of arm Yes  . Bilateral carpal tunnel syndrome   . Lesions of both ulnar nerves     Plan: Etiology likely is due to recent physical activity with moving houses, nerves inflamed, and has known hx/o abnormal nerve conduction studies in 2013.  He has Gabapentin at home he is not taking.  Advised he begin back on 400mg  gabapentin daily for the next 3-4 weeks, use the CTS wrist splints QHS for the next 3-4 wk, OTC Aleve once to twice daily for 1-2 weeks.  F/u 55mo.    Onalee HuaDavid was seen today for tingling.  Diagnoses and all orders for this visit:  Paresthesia of arm  Bilateral carpal tunnel syndrome  Lesions of both ulnar nerves

## 2015-10-25 ENCOUNTER — Encounter: Payer: Self-pay | Admitting: Medical

## 2015-10-27 ENCOUNTER — Other Ambulatory Visit: Payer: Self-pay | Admitting: Medical

## 2015-10-27 MED ORDER — SILDENAFIL CITRATE 100 MG PO TABS: ORAL_TABLET | ORAL | 3 refills | Status: DC

## 2015-11-15 ENCOUNTER — Other Ambulatory Visit: Payer: Self-pay | Admitting: Medical

## 2015-11-15 ENCOUNTER — Telehealth: Payer: Self-pay

## 2015-11-15 MED ORDER — SILDENAFIL CITRATE 100 MG PO TABS: ORAL_TABLET | ORAL | 0 refills | Status: DC

## 2015-11-15 NOTE — Telephone Encounter (Signed)
rx ready 

## 2015-11-15 NOTE — Telephone Encounter (Signed)
Need to verify Unit number. Demographics reflect [address] at this address but pt did not report this to me when he asked for it to be mailed. /RLB   LM for pt to CB

## 2015-11-15 NOTE — Telephone Encounter (Signed)
Pt states that he would like a paper script of the Viagra so [place] can mail it [place] get it filled cheaper. His new [place] is [address] [place], [place]  [id number].  Please call pt when mailed. [phone]

## 2015-11-17 NOTE — Telephone Encounter (Signed)
Pt called back and he will be picking up the script today so he does not want it mailed

## 2015-11-17 NOTE — Telephone Encounter (Signed)
Called pt twice (once yesterday and once today) and left messages to call our office to verify the address where the script should be mailed

## 2016-08-08 ENCOUNTER — Encounter: Payer: Self-pay | Admitting: Medical

## 2016-08-08 ENCOUNTER — Ambulatory Visit (INDEPENDENT_AMBULATORY_CARE_PROVIDER_SITE_OTHER): Payer: BLUE CROSS/BLUE SHIELD | Admitting: Medical

## 2016-08-08 VITALS — BP 124/86 | HR 76 | Temp 98.0°F | Wt 235.4 lb

## 2016-08-08 DIAGNOSIS — H01004 Unspecified blepharitis left upper eyelid: Secondary | ICD-10-CM | POA: Insufficient documentation

## 2016-08-08 DIAGNOSIS — N521 Erectile dysfunction due to diseases classified elsewhere: Secondary | ICD-10-CM

## 2016-08-08 DIAGNOSIS — H109 Unspecified conjunctivitis: Secondary | ICD-10-CM | POA: Diagnosis not present

## 2016-08-08 MED ORDER — SILDENAFIL CITRATE 100 MG PO TABS: ORAL_TABLET | ORAL | 0 refills | Status: DC

## 2016-08-08 MED ORDER — ERYTHROMYCIN 5 MG/GM OP OINT: 1.0000 "application " | TOPICAL_OINTMENT | Freq: Four times a day (QID) | OPHTHALMIC | 1 refills | Status: DC

## 2016-08-08 MED ORDER — AMOXICILLIN 875 MG PO TABS: 875.0000 mg | ORAL_TABLET | Freq: Two times a day (BID) | ORAL | 0 refills | Status: DC

## 2016-08-08 NOTE — Progress Notes (Signed)
Subjective: Chief Complaint  Patient presents with  . swelling in lt eye lid    swelling in left eye 1 week ago , he did have swelling in rt the right eye at first    Here for swelling in eye.  From time to time gets swelling and blockages of ducts in eyelids.  Recurrent issue.  Had similar issue in right eye weeks ago.  About a week ago started getting same in left eye after right eye calmed down.   Been using the same ointment he already has but its not helping the left eye.  Uses warm compresses.   Recently he mixed tumeric and coconut oil on qtip and been using this over eyelid. That seems to be helping.  No fever.   No NV.   No heat or redness.   Isolated to eyelid.  Sees eye doctor regularly through [facility], last visit 5 months ago.  No other aggravating or relieving factors.   Also needs refill on Viagra, does fine on this.   no other complaint.  Past Medical History:  Diagnosis Date  . Diabetes mellitus   . ED (erectile dysfunction)   . GERD (gastroesophageal reflux disease)   . Hyperlipidemia    Current Outpatient Prescriptions on File Prior to Visit  Medication Sig Dispense Refill  . aspirin 81 MG tablet Take 81 mg by mouth daily.    Marland Kitchen emollient (BIAFINE) cream Apply topically as needed.    . gabapentin (NEURONTIN) 400 MG capsule Take 400 mg by mouth as needed.    Marland Kitchen glucagon (GLUCAGON EMERGENCY) 1 MG injection Inject 1 mg into the vein as needed.    . insulin aspart (NOVOLOG) 100 UNIT/ML injection Inject 10-15 Units into the skin 3 (three) times daily before meals.     . insulin glargine (LANTUS) 100 UNIT/ML injection Inject 18 Units into the skin 2 (two) times daily.    Marland Kitchen losartan (COZAAR) 25 MG tablet Take 25 mg by mouth daily.    . simvastatin (ZOCOR) 40 MG tablet Take 40 mg by mouth every evening.     No current facility-administered medications on file prior to visit.    ROS as in subjective   Objective: BP 124/86   Pulse 76   Temp 98 F (36.7 C)   Wt 235 lb  6.4 oz (106.8 kg)   SpO2 96%   BMI 31.06 kg/m   Gen: wd, wn, nad Left eyelid swelling with and mild erythema, conjunctiva bilat injection, some mild crusting discharge othwries PERRLA, EOMi HENT otherwise WNL    Assessment: Encounter Diagnoses  Name Primary?  . Blepharitis of left upper eyelid, unspecified type Yes  . Conjunctivitis, unspecified conjunctivitis type, unspecified laterality   . Erectile dysfunction due to diseases classified elsewhere     Plan: Blepharitis, conjunctivas - begin romycin, discussed hygiene, prevention, warm compresses.   If worse particular redness, swelling of orbit, then begin oral amoxillin.  otherwise lets given Romycin time to work.   ED - refilled Viagra, discussed safety, proper use  Wildon was seen today for swelling in lt eye lid.  Diagnoses and all orders for this visit:  Blepharitis of left upper eyelid, unspecified type  Conjunctivitis, unspecified conjunctivitis type, unspecified laterality  Erectile dysfunction due to diseases classified elsewhere  Other orders -     erythromycin [facility]) ophthalmic ointment; Place 1 application into both eyes 4 (four) times daily. -     sildenafil (VIAGRA) 100 MG tablet; 1 tablet po daily prn -  amoxicillin (AMOXIL) 875 MG tablet; Take 1 tablet (875 mg total) by mouth 2 (two) times daily.

## 2017-01-03 ENCOUNTER — Ambulatory Visit: Payer: BLUE CROSS/BLUE SHIELD | Admitting: Medical

## 2017-01-03 ENCOUNTER — Encounter: Payer: Self-pay | Admitting: Medical

## 2017-01-03 VITALS — BP 128/70 | HR 87 | Temp 98.3°F | Wt 235.2 lb

## 2017-01-03 DIAGNOSIS — R197 Diarrhea, unspecified: Secondary | ICD-10-CM

## 2017-01-03 DIAGNOSIS — M65322 Trigger finger, left index finger: Secondary | ICD-10-CM | POA: Diagnosis not present

## 2017-01-03 DIAGNOSIS — B349 Viral infection, unspecified: Secondary | ICD-10-CM | POA: Diagnosis not present

## 2017-01-03 DIAGNOSIS — J3489 Other specified disorders of nose and nasal sinuses: Secondary | ICD-10-CM | POA: Diagnosis not present

## 2017-01-03 MED ORDER — AMOXICILLIN 875 MG PO TABS: 875.0000 mg | ORAL_TABLET | Freq: Two times a day (BID) | ORAL | 0 refills | Status: AC

## 2017-01-03 NOTE — Progress Notes (Signed)
Subjective: Chief Complaint  Patient presents with  . headache, stomach ,bobyache    stomachaches, chills, bodyaches, no fever  x3 days   Here for illness.  He reports 3-day history of not feeling well.  This started 3 days ago with sharp stomach pains, pounding headache, body aches and chills, that occurred in the evening.  He went on the sleep and then later started to have some coughing.  Over the last 2 days he has developed loose runny stools about 2-3 stools a day, some nausea, no vomiting.  He never developed a fever but felt a little hot.  Denies blood in the stool.  There may have been some mucus in the stool but he is not sure.  No urinary complaint.  He felt really bad the first day, but has gradually improved.  He has used some Tylenol for headache and bellyache.  He thought it may be food poisoning but other people has eaten the same food he ate without getting sick.  He does have a little sinus pressure and mucus drainage.  [facility] sees [facility] for most of his care, recent hemoglobin A1c around 9.5%.  Blood glucose a little high under 200s fasting  He also notes left index finger trigger reaction for the last few months, that has gradually gotten a little worse.  He spoke to his primary care provider at the [facility] about this.  He reported that they advised that people with diabetes sometimes get this.  No particular treatment was recommended.   Past Medical History:  Diagnosis Date  . Diabetes mellitus   . ED (erectile dysfunction)   . GERD (gastroesophageal reflux disease)   . Hyperlipidemia    Past Surgical History:  Procedure Laterality Date  . ABSCESS DRAINAGE    . ANKLE SURGERY    . TONSILLECTOMY    . VASECTOMY      Current Outpatient Medications on File Prior to Visit  Medication Sig Dispense Refill  . aspirin 81 MG tablet Take 81 mg by mouth daily.    Marland Kitchen. emollient (BIAFINE) cream Apply topically as needed.    . gabapentin (NEURONTIN) 400 MG capsule Take 400 mg by  mouth as needed.    Marland Kitchen. glucagon (GLUCAGON EMERGENCY) 1 MG injection Inject 1 mg into the vein as needed.    . insulin aspart (NOVOLOG) 100 UNIT/ML injection Inject 10-15 Units into the skin 3 (three) times daily before meals.     . insulin glargine (LANTUS) 100 UNIT/ML injection Inject 18 Units into the skin 2 (two) times daily.    Marland Kitchen. losartan (COZAAR) 25 MG tablet Take 25 mg by mouth daily.    . sildenafil (VIAGRA) 100 MG tablet 1 tablet po daily prn 90 tablet 0  . simvastatin (ZOCOR) 40 MG tablet Take 40 mg by mouth every evening.     No current facility-administered medications on file prior to visit.    ROS as in subjective   Objective: BP 128/70   Pulse 87   Temp 98.3 F (36.8 C)   Wt 235 lb 3.2 oz (106.7 kg)   SpO2 98%   BMI 31.03 kg/m   Wt Readings from Last 3 Encounters:  01/03/17 235 lb 3.2 oz (106.7 kg)  08/08/16 235 lb 6.4 oz (106.8 kg)  10/17/15 228 lb (103.4 kg)   General: Well-developed, well-nourished, no acute distress Skin unremarkable Left index finger with tenderness over the volar MCP, slight trigger action at times noted with range of motion, otherwise hand  and fingers nontender without deformity, normal range of motion Arms neurovascularly intact Lungs clear Heart RRR, normal S1, S2, no murmurs Abdomen: Left mid abdomen adjacent to the umbilicus with 4 cm puffy area with small red injection point noted ,(this is where he injects his insulin alternating sides )positive bowel sounds, soft, nontender, no organomegaly, no mass Back nontender No edema Pulses 2+   Assessment: Encounter Diagnoses  Name Primary?  . Trigger index finger of left hand Yes  . Viral syndrome   . Diarrhea, unspecified type   . Sinus pressure      Plan: Symptoms and exam suggest viral syndrome.  Advised rest, plenty of clear fluids for hydration, can use Mucinex DM for the head pressure and cough, Pepto-Bismol if desired for diarrhea and upset stomach.  If the head pressure and  mucus and cough worsens over the next several days can use amoxicillin but currently things seem viral.  Call or return if not seeing improvement within the next few days.  Discussed handwashing and preventative measures  Trigger finger-referral to hand surgeon for consult   Sean HuaDavid was seen today for headache, stomach ,bobyache.  Diagnoses and all orders for this visit:  Trigger index finger of left hand -     Ambulatory referral to Hand Surgery  Viral syndrome  Diarrhea, unspecified type  Sinus pressure  Other orders -     amoxicillin (AMOXIL) 875 MG tablet; Take 1 tablet (875 mg total) by mouth 2 (two) times daily.

## 2017-04-16 ENCOUNTER — Telehealth: Payer: Self-pay | Admitting: Medical

## 2017-04-16 ENCOUNTER — Encounter: Payer: Self-pay | Admitting: Medical

## 2017-04-16 NOTE — Telephone Encounter (Signed)
Pt was notified and will check mychart immunization first and if not will come by and pick immunization record

## 2017-04-16 NOTE — Telephone Encounter (Signed)
New Message  Pt verbalized wanting CBC shot records or any immunization records for his passport vaccinations prior to him traveling.  Please f/u with pt

## 2017-08-19 ENCOUNTER — Encounter: Payer: Self-pay | Admitting: Medical

## 2017-08-20 ENCOUNTER — Other Ambulatory Visit: Payer: Self-pay | Admitting: Medical

## 2017-08-20 ENCOUNTER — Telehealth: Payer: Self-pay | Admitting: Medical

## 2017-08-20 MED ORDER — SILDENAFIL CITRATE 100 MG PO TABS: ORAL_TABLET | ORAL | 0 refills | Status: AC

## 2017-08-20 NOTE — Telephone Encounter (Signed)
Emailed RX to Sean Callahan and he states that he had A CPE with the [facility] yesterday and he is going to get all of his records from them sent over here for you to review and if you still want to see him that will be ok,

## 2017-08-20 NOTE — Telephone Encounter (Signed)
I printed script.   See if he wants it mailed to him?   Lets schedule him a physical. Although he sees specialist, we don't have any recent ppreventativecare visit

## 2017-09-03 NOTE — Telephone Encounter (Signed)
Lets await records from [facility]

## 2019-01-13 ENCOUNTER — Telehealth: Payer: Self-pay | Admitting: Medical

## 2019-01-13 NOTE — Telephone Encounter (Signed)
He sent refill request.  Last visit 2018.  Needs CPX visit.  Of note, if he is seeing [facility] regularly, need last 1 or 2 office notes and labs from them as well as from endocrinology to help update his chart record.

## 2019-01-14 ENCOUNTER — Other Ambulatory Visit: Payer: Self-pay | Admitting: Medical

## 2019-01-14 NOTE — Telephone Encounter (Signed)
Got pt scheduled for CPE in Feb.

## 2019-03-22 ENCOUNTER — Telehealth: Payer: Self-pay | Admitting: Medical

## 2019-03-22 NOTE — Telephone Encounter (Signed)
Please see his email info so we can request records from the [place] [facility]  I would like his most recent 2 sets of labs, last colonoscopy report, vaccine records/summary, most recent echocardiogram and stress test reports, and last 2 office notes from his diabetes specialist and primary provider in the [place] system

## 2019-03-23 NOTE — Telephone Encounter (Signed)
For you -

## 2019-03-24 ENCOUNTER — Encounter: Payer: BLUE CROSS/BLUE SHIELD | Admitting: Medical

## 2019-04-16 ENCOUNTER — Other Ambulatory Visit: Payer: Self-pay

## 2019-04-16 ENCOUNTER — Encounter: Payer: Self-pay | Admitting: Medical

## 2019-04-16 ENCOUNTER — Ambulatory Visit (INDEPENDENT_AMBULATORY_CARE_PROVIDER_SITE_OTHER): Payer: Self-pay | Admitting: Medical

## 2019-04-16 VITALS — BP 128/78 | HR 86 | Temp 98.7°F | Ht 74.5 in | Wt 238.8 lb

## 2019-04-16 DIAGNOSIS — E1069 Type 1 diabetes mellitus with other specified complication: Secondary | ICD-10-CM

## 2019-04-16 DIAGNOSIS — E785 Hyperlipidemia, unspecified: Secondary | ICD-10-CM

## 2019-04-16 DIAGNOSIS — Z7189 Other specified counseling: Secondary | ICD-10-CM

## 2019-04-16 DIAGNOSIS — Z1211 Encounter for screening for malignant neoplasm of colon: Secondary | ICD-10-CM

## 2019-04-16 DIAGNOSIS — R911 Solitary pulmonary nodule: Secondary | ICD-10-CM | POA: Insufficient documentation

## 2019-04-16 DIAGNOSIS — Z7185 Encounter for immunization safety counseling: Secondary | ICD-10-CM

## 2019-04-16 DIAGNOSIS — Z125 Encounter for screening for malignant neoplasm of prostate: Secondary | ICD-10-CM

## 2019-04-16 DIAGNOSIS — F528 Other sexual dysfunction not due to a substance or known physiological condition: Secondary | ICD-10-CM

## 2019-04-16 MED ORDER — TADALAFIL 20 MG PO TABS: 20.0000 mg | ORAL_TABLET | Freq: Every day | ORAL | 0 refills | Status: AC | PRN

## 2019-04-16 MED ORDER — TADALAFIL 20 MG PO TABS: 20.0000 mg | ORAL_TABLET | Freq: Every day | ORAL | 0 refills | Status: DC | PRN

## 2019-04-16 NOTE — Progress Notes (Signed)
Subjective: Chief Complaint  Patient presents with  . Annual Exam    no labs    Here for f/u.  Is on [facility], sees [facility] for primary care  Medical Team: Dr. Annitta Jersey, PCP at the [facility] Sees Endocrinology at [facility] in [place] occasional at the [place] for [place] and eye doctor at the [place]  Concerns: Has been in [place] for the past 6 months.  He works remotely but his wife works as a Garment/textile technologist and they were relocated to wife for a 98-monthstent  ED - Has Viagra prescription that has run out.  [facility]  Generally the Viagra 109mworks ok.  Can get some erections on his own but they don't hold or last.      [facility]  He has Dexcom glucometer.   They are trying to adjust Novolog to carb intake.    Still on Lantus 42u QHS.    Has had Covid #1 vaccine.   Due for 2nd one soon.   A day or so after 1st injection saw 2 round flat red spots on left neck but they have faded.    Had Tdap 2013  He is pending turning in cologard kit.    No new c/o.    Past Medical History:  Diagnosis Date  . Diabetes mellitus   . ED (erectile dysfunction)   . GERD (gastroesophageal reflux disease)   . Hyperlipidemia    Current Outpatient Medications on File Prior to Visit  Medication Sig Dispense Refill  . emollient (BIAFINE) cream Apply topically as needed.    . gabapentin (NEURONTIN) 400 MG capsule Take 400 mg by mouth as needed.    . Marland Kitchenlucagon (GLUCAGON EMERGENCY) 1 MG injection Inject 1 mg into the vein as needed.    . insulin aspart (NOVOLOG) 100 UNIT/ML injection Inject 10-15 Units into the skin 3 (three) times daily before meals.     . insulin glargine (LANTUS) 100 UNIT/ML injection Inject 18 Units into the skin 2 (two) times daily.    . Marland Kitchenosartan (COZAAR) 25 MG tablet Take 25 mg by mouth daily.    . sildenafil (VIAGRA) 100 MG tablet 1 tablet po daily prn 90 tablet 0  . simvastatin  (ZOCOR) 40 MG tablet Take 40 mg by mouth every evening.    . Marland Kitchenmoxicillin (AMOXIL) 875 MG tablet Take 1 tablet (875 mg total) by mouth 2 (two) times daily. (Patient not taking: Reported on 04/16/2019) 20 tablet 0  . aspirin 81 MG tablet Take 81 mg by mouth daily.     No current facility-administered medications on file prior to visit.   ROS as in subjective    Objective: BP 128/78   Pulse 86   Temp 98.7 F (37.1 C)   Ht 6' 2.5" (1.892 m)   Wt 238 lb 12.8 oz (108.3 kg)   SpO2 97%   BMI 30.25 kg/m   General appearence: alert, no distress, WD/WN,  Neck: supple, no lymphadenopathy, no thyromegaly, no masses Heart: RRR, normal S1, S2, no murmurs Lungs: CTA bilaterally, no wheezes, rhonchi, or rales Abdomen: +bs, soft, non tender, non distended, no masses, no hepatomegaly, no splenomegaly Pulses: 2+ symmetric, upper and lower extremities, normal cap refill GU: normal male GU, circumcised, no nodule, no lymphadenopathy    Assessment: Encounter Diagnoses  Name Primary?  . ERECTILE DYSFUNCTION Yes  . Type 1 diabetes mellitus with other specified complication ([place]  .  Vaccine counseling   . Screen for colon cancer   . Screening for prostate cancer   . Hyperlipidemia, unspecified hyperlipidemia type   . Pulmonary nodule     Plan: I reviewed recent records from the [place] including labs from February and March 2021, chest x-ray from 2020, PSA screen it was normal February 2021.    I reviewed recent labs that he sent over from the [place] [facility].  Hemoglobin A1c 8.2% on March 25, 2019 Other labs in February and March 2021 including normal vitamin D, normal TSH, PSA 0.631, CBC normal other than some minor differential findings and hemoglobin elevated at 16.5, comprehensive metabolic panel normal, lipid panel at goal, BUN 18, creatinine 1.2, estimated GFR greater than 60.  Lipid panel included total cholesterol 150, triglycerides 119, HDL 60, LDL 66.  Diabetes-managed by the [place].   He is compliant with statin and ARB  Erectile dysfunction  - begin trial of Cialis.  Gets some improvement with Viagra, but not full.  Discussed proper use of Cialis.   If this works better, then use the larger script to send for mail order.  Consider a vacuum erection device "pump" or particular the bands for up to 20 minutes of use.  Discussed proper use of this VED  See your eye doctor yearly for routine vision care.  See your dentist yearly for routine dental care including hygiene visits twice yearly.  Turn in your Cologard kit for colon cancer screen.  Your PSA prostate cancer screen was normal recently.  Has yearly surveillance at the [facility] for pulmonary nodules  Sakai was seen today for annual exam.  Diagnoses and all orders for this visit:  ERECTILE DYSFUNCTION  Type 1 diabetes mellitus with other specified complication ([place])  Vaccine counseling  Screen for colon cancer  Screening for prostate cancer  Hyperlipidemia, unspecified hyperlipidemia type  Pulmonary nodule  Other orders -     Discontinue: tadalafil (CIALIS) 20 MG tablet; Take 1 tablet (20 mg total) by mouth daily as needed for erectile dysfunction. -     tadalafil (CIALIS) 20 MG tablet; Take 1 tablet (20 mg total) by mouth daily as needed for erectile dysfunction.

## 2019-04-16 NOTE — Patient Instructions (Addendum)
Recommendations: Ask your PCP at the [place] about [place] or Vania Rea which are used in diabetics to help lower sugars but also reduce heart disease risks   Try the Cialis.  If this works better, then use the larger script to send for mail order.  Consider a vaccume erection device "pump" or particular the bands for up to 20 minutes of use.   See your eye doctor yearly for routine vision care.  See your dentist yearly for routine dental care including hygiene visits twice yearly.  Turn in your Cologard kit for colon cancer screen.  Your PSA prostate cancer screen was normal recently.    Erectile Dysfunction Erectile dysfunction (ED) is the inability to get or keep an erection in order to have sexual intercourse. Erectile dysfunction may include:  Inability to get an erection.  Lack of enough hardness of the erection to allow penetration.  Loss of the erection before sex is finished. What are the causes? This condition may be caused by:  Certain medicines, such as: ? Pain relievers. ? Antihistamines. ? Antidepressants. ? Blood pressure medicines. ? Water pills (diuretics). ? Ulcer medicines. ? Muscle relaxants. ? Drugs.  Excessive drinking.  Psychological causes, such as: ? Anxiety. ? Depression. ? Sadness. ? Exhaustion. ? Performance fear. ? Stress.  Physical causes, such as: ? Artery problems. This may include diabetes, smoking, liver disease, or atherosclerosis. ? High blood pressure. ? Hormonal problems, such as low testosterone. ? Obesity. ? Nerve problems. This may include back or pelvic injuries, diabetes mellitus, multiple sclerosis, or Parkinson disease. What are the signs or symptoms? Symptoms of this condition include:  Inability to get an erection.  Lack of enough hardness of the erection to allow penetration.  Loss of the erection before sex is finished.  Normal erections at some times, but with frequent unsatisfactory episodes.  Low sexual  satisfaction in either partner due to erection problems.  A curved penis occurring with erection. The curve may cause pain or the penis may be too curved to allow for intercourse.  Never having nighttime erections. How is this diagnosed? This condition is often diagnosed by:  Performing a physical exam to find other diseases or specific problems with the penis.  Asking you detailed questions about the problem.  Performing blood tests to check for diabetes mellitus or to measure hormone levels.  Performing other tests to check for underlying health conditions.  Performing an ultrasound exam to check for scarring.  Performing a test to check blood flow to the penis.  Doing a sleep study at home to measure nighttime erections. How is this treated? This condition may be treated by:  Medicine taken by mouth to help you achieve an erection (oral medicine).  Hormone replacement therapy to replace low testosterone levels.  Medicine that is injected into the penis. Your health care provider may instruct you how to give yourself these injections at home.  Vacuum pump. This is a pump with a ring on it. The pump and ring are placed on the penis and used to create pressure that helps the penis become erect.  Penile implant surgery. In this procedure, you may receive: ? An inflatable implant. This consists of cylinders, a pump, and a reservoir. The cylinders can be inflated with a fluid that helps to create an erection, and they can be deflated after intercourse. ? A semi-rigid implant. This consists of two silicone rubber rods. The rods provide some rigidity. They are also flexible, so the penis can both curve  downward in its normal position and become straight for sexual intercourse.  Blood vessel surgery, to improve blood flow to the penis. During this procedure, a blood vessel from a different part of the body is placed into the penis to allow blood to flow around (bypass) damaged or  blocked blood vessels.  Lifestyle changes, such as exercising more, losing weight, and quitting smoking. Follow these instructions at home: Medicines   Take over-the-counter and prescription medicines only as told by your health care provider. Do not increase the dosage without first discussing it with your health care provider.  If you are using self-injections, perform injections as directed by your health care provider. Make sure to avoid any veins that are on the surface of the penis. After giving an injection, apply pressure to the injection site for 5 minutes. General instructions  Exercise regularly, as directed by your health care provider. Work with your health care provider to lose weight, if needed.  Do not use any products that contain nicotine or tobacco, such as cigarettes and e-cigarettes. If you need help quitting, ask your health care provider.  Before using a vacuum pump, read the instructions that come with the pump and discuss any questions with your health care provider.  Keep all follow-up visits as told by your health care provider. This is important. Contact a health care provider if:  You feel nauseous.  You vomit. Get help right away if:  You are taking oral or injectable medicines and you have an erection that lasts longer than 4 hours. If your health care provider is unavailable, go to the nearest emergency room for evaluation. An erection that lasts much longer than 4 hours can result in permanent damage to your penis.  You have severe pain in your groin or abdomen.  You develop redness or severe swelling of your penis.  You have redness spreading up into your groin or lower abdomen.  You are unable to urinate.  You experience chest pain or a rapid heart beat (palpitations) after taking oral medicines. Summary  Erectile dysfunction (ED) is the inability to get or keep an erection during sexual intercourse. This problem can usually be treated  successfully.  This condition is diagnosed based on a physical exam, your symptoms, and tests to determine the cause. Treatment varies depending on the cause, and may include medicines, hormone therapy, surgery, or vacuum pump.  You may need follow-up visits to make sure that you are using your medicines or devices correctly.  Get help right away if you are taking or injecting medicines and you have an erection that lasts longer than 4 hours. This information is not intended to replace advice given to you by your health care provider. Make sure you discuss any questions you have with your health care provider. Document Revised: 12/27/2016 Document Reviewed: 01/31/2016 Elsevier Patient Education  [place].

## 2019-04-26 ENCOUNTER — Encounter: Payer: Self-pay | Admitting: Medical

## 2019-10-26 ENCOUNTER — Encounter: Payer: Self-pay | Admitting: Medical

## 2020-06-11 LAB — FECAL OCCULT BLOOD, IMMUNOCHEMICAL: IFOBT: NEGATIVE

## 2020-09-06 ENCOUNTER — Encounter: Payer: Self-pay | Admitting: Internal Medicine

## 2021-01-08 ENCOUNTER — Encounter: Payer: Self-pay | Admitting: Family Medicine

## 2021-01-09 ENCOUNTER — Other Ambulatory Visit (INDEPENDENT_AMBULATORY_CARE_PROVIDER_SITE_OTHER): Payer: Self-pay

## 2021-01-09 ENCOUNTER — Encounter: Payer: Self-pay | Admitting: Family Medicine

## 2021-01-09 ENCOUNTER — Telehealth (INDEPENDENT_AMBULATORY_CARE_PROVIDER_SITE_OTHER): Payer: Self-pay | Admitting: Family Medicine

## 2021-01-09 ENCOUNTER — Other Ambulatory Visit: Payer: Self-pay

## 2021-01-09 VITALS — Temp 97.9°F | Wt 230.0 lb

## 2021-01-09 DIAGNOSIS — U071 COVID-19: Secondary | ICD-10-CM

## 2021-01-09 LAB — POC COVID19 BINAXNOW: SARS Coronavirus 2 Ag: POSITIVE — AB

## 2021-01-09 NOTE — Progress Notes (Signed)
   Subjective:    Patient ID: Sean Callahan, male    DOB: April 13, 1965, 55 y.o.   MRN: [id number]  HPI Documentation for virtual audio and video telecommunications through [place] encounter: The patient was located at home. 2 patient identifiers used.  The provider was located in the office. The patient did consent to this visit and is aware of possible charges through their insurance for this visit. The other persons participating in this telemedicine service were none. Time spent on call was 5 minutes and in review of previous records >18 minutes total for counseling and coordination of care. This virtual service is not related to other E/M service within previous 7 days.  He states that on Friday he developed rhinorrhea, dry cough, arthralgias and a groggy feeling.  He tested positive for COVID on that day.  Today the arthralgias have gone away and he still has a slightly dry cough and rhinorrhea but definitely feels better.  He is supposed to be flying today.  He will need documentation of a positive test other than the one that he did at home.  Review of Systems     Objective:   Physical Exam Alert and in no distress with a slightly gravelly voice. COVID test is positive.      Assessment & Plan:  COVID-19 - Plan: POC COVID-19 I explained that since she is feeling better, at the end of 5 days which would be after Wednesday he may return to normal activities and wear a mask for another 5 days.   I will fill out appropriate work to help him with the trip that he had to cancel.

## 2021-05-04 LAB — FECAL OCCULT BLOOD, IMMUNOCHEMICAL: IFOBT: NEGATIVE

## 2021-05-04 LAB — HEMOGLOBIN A1C: Hemoglobin A1C: 7.3

## 2021-05-18 ENCOUNTER — Encounter: Payer: Self-pay | Admitting: Internal Medicine

## 2021-06-01 LAB — HM DIABETES EYE EXAM

## 2021-06-07 ENCOUNTER — Encounter: Payer: Self-pay | Admitting: Internal Medicine

## 2021-10-03 ENCOUNTER — Encounter: Payer: Self-pay | Admitting: Internal Medicine

## 2021-11-06 ENCOUNTER — Encounter: Payer: Self-pay | Admitting: Internal Medicine
